# Patient Record
Sex: Female | Born: 1955
Health system: Southern US, Community
[De-identification: ages and names within clinical notes are randomized; demographics above are authoritative.]

## PROBLEM LIST (undated history)

## (undated) DIAGNOSIS — D219 Benign neoplasm of connective and other soft tissue, unspecified: Secondary | ICD-10-CM

## (undated) DIAGNOSIS — J189 Pneumonia, unspecified organism: Secondary | ICD-10-CM

## (undated) DIAGNOSIS — H539 Unspecified visual disturbance: Secondary | ICD-10-CM

## (undated) DIAGNOSIS — Z8711 Personal history of peptic ulcer disease: Secondary | ICD-10-CM

## (undated) DIAGNOSIS — G5790 Unspecified mononeuropathy of unspecified lower limb: Secondary | ICD-10-CM

## (undated) DIAGNOSIS — H409 Unspecified glaucoma: Secondary | ICD-10-CM

## (undated) DIAGNOSIS — H269 Unspecified cataract: Secondary | ICD-10-CM

## (undated) DIAGNOSIS — M797 Fibromyalgia: Secondary | ICD-10-CM

## (undated) DIAGNOSIS — Z8719 Personal history of other diseases of the digestive system: Secondary | ICD-10-CM

## (undated) HISTORY — DX: Unspecified glaucoma: H40.9

## (undated) HISTORY — DX: Unspecified visual disturbance: H53.9

## (undated) HISTORY — DX: Pneumonia, unspecified organism: J18.9

## (undated) HISTORY — DX: Unspecified mononeuropathy of unspecified lower limb: G57.90

## (undated) HISTORY — DX: Personal history of other diseases of the digestive system: Z87.19

## (undated) HISTORY — DX: Personal history of peptic ulcer disease: Z87.11

## (undated) HISTORY — PX: OTHER SURGICAL HISTORY: SHX169

## (undated) HISTORY — DX: Unspecified cataract: H26.9

## (undated) HISTORY — DX: Fibromyalgia: M79.7

---

## 1998-10-07 HISTORY — PX: OTHER SURGICAL HISTORY: SHX169

## 2004-02-06 HISTORY — PX: OTHER SURGICAL HISTORY: SHX169

## 2008-11-05 HISTORY — PX: OTHER SURGICAL HISTORY: SHX169

## 2010-11-06 HISTORY — PX: OTHER SURGICAL HISTORY: SHX169

## 2015-06-25 DIAGNOSIS — Z23 Encounter for immunization: Secondary | ICD-10-CM | POA: Diagnosis not present

## 2015-08-23 DIAGNOSIS — M19042 Primary osteoarthritis, left hand: Secondary | ICD-10-CM | POA: Diagnosis not present

## 2015-08-23 DIAGNOSIS — M25562 Pain in left knee: Secondary | ICD-10-CM | POA: Diagnosis not present

## 2015-08-23 DIAGNOSIS — M797 Fibromyalgia: Secondary | ICD-10-CM | POA: Diagnosis not present

## 2015-08-23 DIAGNOSIS — M25561 Pain in right knee: Secondary | ICD-10-CM | POA: Diagnosis not present

## 2015-08-23 DIAGNOSIS — M19041 Primary osteoarthritis, right hand: Secondary | ICD-10-CM | POA: Diagnosis not present

## 2015-08-23 DIAGNOSIS — F5104 Psychophysiologic insomnia: Secondary | ICD-10-CM | POA: Diagnosis not present

## 2015-10-24 DIAGNOSIS — M5137 Other intervertebral disc degeneration, lumbosacral region: Secondary | ICD-10-CM | POA: Diagnosis not present

## 2015-10-24 DIAGNOSIS — M9902 Segmental and somatic dysfunction of thoracic region: Secondary | ICD-10-CM | POA: Diagnosis not present

## 2015-10-24 DIAGNOSIS — M9904 Segmental and somatic dysfunction of sacral region: Secondary | ICD-10-CM | POA: Diagnosis not present

## 2015-10-24 DIAGNOSIS — M9903 Segmental and somatic dysfunction of lumbar region: Secondary | ICD-10-CM | POA: Diagnosis not present

## 2015-10-26 DIAGNOSIS — M9902 Segmental and somatic dysfunction of thoracic region: Secondary | ICD-10-CM | POA: Diagnosis not present

## 2015-10-26 DIAGNOSIS — M9903 Segmental and somatic dysfunction of lumbar region: Secondary | ICD-10-CM | POA: Diagnosis not present

## 2015-10-26 DIAGNOSIS — M5137 Other intervertebral disc degeneration, lumbosacral region: Secondary | ICD-10-CM | POA: Diagnosis not present

## 2015-10-26 DIAGNOSIS — M9904 Segmental and somatic dysfunction of sacral region: Secondary | ICD-10-CM | POA: Diagnosis not present

## 2015-11-03 DIAGNOSIS — M961 Postlaminectomy syndrome, not elsewhere classified: Secondary | ICD-10-CM | POA: Diagnosis not present

## 2015-11-23 DIAGNOSIS — M961 Postlaminectomy syndrome, not elsewhere classified: Secondary | ICD-10-CM | POA: Diagnosis not present

## 2015-11-30 DIAGNOSIS — H40003 Preglaucoma, unspecified, bilateral: Secondary | ICD-10-CM | POA: Diagnosis not present

## 2015-12-07 DIAGNOSIS — M961 Postlaminectomy syndrome, not elsewhere classified: Secondary | ICD-10-CM | POA: Diagnosis not present

## 2015-12-07 DIAGNOSIS — M47816 Spondylosis without myelopathy or radiculopathy, lumbar region: Secondary | ICD-10-CM | POA: Diagnosis not present

## 2016-01-13 DIAGNOSIS — M47816 Spondylosis without myelopathy or radiculopathy, lumbar region: Secondary | ICD-10-CM | POA: Diagnosis not present

## 2016-01-13 DIAGNOSIS — M5136 Other intervertebral disc degeneration, lumbar region: Secondary | ICD-10-CM | POA: Diagnosis not present

## 2016-01-27 DIAGNOSIS — M961 Postlaminectomy syndrome, not elsewhere classified: Secondary | ICD-10-CM | POA: Diagnosis not present

## 2016-01-27 DIAGNOSIS — M47816 Spondylosis without myelopathy or radiculopathy, lumbar region: Secondary | ICD-10-CM | POA: Diagnosis not present

## 2016-02-09 DIAGNOSIS — J180 Bronchopneumonia, unspecified organism: Secondary | ICD-10-CM | POA: Diagnosis not present

## 2016-02-18 DIAGNOSIS — M961 Postlaminectomy syndrome, not elsewhere classified: Secondary | ICD-10-CM | POA: Diagnosis not present

## 2016-02-18 DIAGNOSIS — M47816 Spondylosis without myelopathy or radiculopathy, lumbar region: Secondary | ICD-10-CM | POA: Diagnosis not present

## 2016-03-08 ENCOUNTER — Emergency Department (HOSPITAL_COMMUNITY): Payer: BLUE CROSS/BLUE SHIELD

## 2016-03-08 ENCOUNTER — Encounter (HOSPITAL_COMMUNITY): Payer: Self-pay

## 2016-03-08 ENCOUNTER — Emergency Department (HOSPITAL_COMMUNITY)
Admission: EM | Admit: 2016-03-08 | Discharge: 2016-03-08 | Disposition: A | Discharge status: Home / Self Care | Payer: BLUE CROSS/BLUE SHIELD | Attending: Emergency Medicine | Admitting: Emergency Medicine

## 2016-03-08 ENCOUNTER — Other Ambulatory Visit: Payer: Self-pay

## 2016-03-08 DIAGNOSIS — R791 Abnormal coagulation profile: Secondary | ICD-10-CM | POA: Insufficient documentation

## 2016-03-08 DIAGNOSIS — H40003 Preglaucoma, unspecified, bilateral: Secondary | ICD-10-CM | POA: Diagnosis not present

## 2016-03-08 DIAGNOSIS — R51 Headache: Secondary | ICD-10-CM | POA: Diagnosis not present

## 2016-03-08 DIAGNOSIS — H538 Other visual disturbances: Secondary | ICD-10-CM | POA: Diagnosis not present

## 2016-03-08 DIAGNOSIS — R519 Headache, unspecified: Secondary | ICD-10-CM

## 2016-03-08 HISTORY — DX: Benign neoplasm of connective and other soft tissue, unspecified: D21.9

## 2016-03-08 LAB — I-STAT TROPONIN, ED: Troponin i, poc: 0 ng/mL (ref 0.00–0.08)

## 2016-03-08 LAB — COMPREHENSIVE METABOLIC PANEL
ALT: 20 U/L (ref 14–54)
ANION GAP: 13 (ref 5–15)
AST: 27 U/L (ref 15–41)
Albumin: 4.2 g/dL (ref 3.5–5.0)
Alkaline Phosphatase: 67 U/L (ref 38–126)
BUN: 8 mg/dL (ref 6–20)
CHLORIDE: 102 mmol/L (ref 101–111)
CO2: 24 mmol/L (ref 22–32)
Calcium: 9.7 mg/dL (ref 8.9–10.3)
Creatinine, Ser: 0.75 mg/dL (ref 0.44–1.00)
GFR calc non Af Amer: >60 mL/min (ref >60)
Glucose, Bld: 123 mg/dL — ABNORMAL HIGH (ref 65–99)
POTASSIUM: 3.9 mmol/L (ref 3.5–5.1)
SODIUM: 139 mmol/L (ref 135–145)
Total Bilirubin: 0.6 mg/dL (ref 0.3–1.2)
Total Protein: 6.7 g/dL (ref 6.5–8.1)

## 2016-03-08 LAB — CBC
HCT: 37 % (ref 36.0–46.0)
Hemoglobin: 12.1 g/dL (ref 12.0–15.0)
MCH: 30.5 pg (ref 26.0–34.0)
MCHC: 32.7 g/dL (ref 30.0–36.0)
MCV: 93.2 fL (ref 78.0–100.0)
PLATELETS: 352 10*3/uL (ref 150–400)
RBC: 3.97 MIL/uL (ref 3.87–5.11)
RDW: 14.4 % (ref 11.5–15.5)
WBC: 6.2 10*3/uL (ref 4.0–10.5)

## 2016-03-08 LAB — DIFFERENTIAL
BASOS ABS: 0 10*3/uL (ref 0.0–0.1)
BASOS PCT: 0 %
EOS ABS: 0.2 10*3/uL (ref 0.0–0.7)
Eosinophils Relative: 3 %
Lymphocytes Relative: 24 %
Lymphs Abs: 1.5 10*3/uL (ref 0.7–4.0)
MONO ABS: 0.3 10*3/uL (ref 0.1–1.0)
Monocytes Relative: 4 %
NEUTROS ABS: 4.2 10*3/uL (ref 1.7–7.7)
Neutrophils Relative %: 69 %

## 2016-03-08 LAB — APTT: APTT: 35 s (ref 24–36)

## 2016-03-08 LAB — PROTIME-INR
INR: 0.98
PROTHROMBIN TIME: 13 s (ref 11.4–15.2)

## 2016-03-08 MED ORDER — IOPAMIDOL (ISOVUE-370) INJECTION 76%
INTRAVENOUS | Status: AC
  Filled 2016-03-08: qty 50

## 2016-03-08 NOTE — ED Triage Notes (Signed)
Patient sent from opthalmology for blurred vision and increased headache x 2 weeks. States has ongoing blurred vision and ophthalmologist states not related to eyes. Alert and oriented

## 2016-03-08 NOTE — ED Provider Notes (Signed)
[facility] DEPT Provider Note   CSN: [id number] Arrival date & time: 03/08/16  1314     History   Chief Complaint Chief Complaint  Patient presents with  . blurred vision Rebecca Rose    HPI Rebecca Rose is a 61 y.o. female.  Rebecca Rose is a 61 y.o. Female with a history of glaucoma who presents to the ED complaining of left blurry vision, left eye pain, and headache for the past three weeks. Patient reports three weeks ago she had an episode of painless loss of vision to her left eye that lasted for about three minutes, then resolved spontaneously. She reports since then she has had blurry vision in her left eye and pain. She was seen by her opthalmologist Dr. Arlina Robes today who did a normal eye work up. Normal eye pressures. Dr. Arlina Robes says her pain and blurry vision is not related to her eyes and needs TIA work up. She was sent to the ER for stroke work up. Patient does not wear contacts or glasses. Patient denies fevers, neck pain, temporal pain, double vision, abdominal pain, nausea, vomiting, numbness, tingling, weakness, head injury, falls, lightheadedness, dizziness, or rashes.   The history is provided by the patient and medical records. No language interpreter was used.    Past Medical History:  Diagnosis Date  . Fibroleiomyoma     There are no active problems to display for this patient.   History reviewed. No pertinent surgical history.  OB History    No data available       Home Medications    Prior to Admission medications   Medication Sig Start Date End Date Taking? Authorizing Provider  acetaminophen (TYLENOL) 325 MG tablet Take 650 mg by mouth 2 (two) times daily.   Yes Historical Provider, MD  Ascorbic Acid (VITAMIN C) 1000 MG tablet Take 1,000 mg by mouth daily.   Yes Historical Provider, MD  Calcium Polycarbophil (FIBER-CAPS PO) Take 1 capsule by mouth daily.   Yes Historical Provider, MD  Cholecalciferol (VITAMIN D3) 5000 units CAPS Take 5,000  Units by mouth daily.   Yes Historical Provider, MD  traMADol (ULTRAM) 50 MG tablet Take 50 mg by mouth daily as needed for pain. 01/26/16  Yes Historical Provider, MD  traMADol (ULTRAM-ER) 200 MG 24 hr tablet Take 200 mg by mouth at bedtime.   Yes Historical Provider, MD  vitamin B-12 (CYANOCOBALAMIN) 1000 MCG tablet Take 1,000 mcg by mouth daily.   Yes Historical Provider, MD    Family History No family history on file.  Social History Social History  Substance Use Topics  . Smoking status: Never Smoker  . Smokeless tobacco: Never Used  . Alcohol use Not on file     Allergies   Patient has no known allergies.   Review of Systems Review of Systems  Constitutional: Negative for chills and fever.  HENT: Negative for congestion and sore throat.   Eyes: Positive for pain and visual disturbance. Negative for photophobia, discharge, redness and itching.  Respiratory: Negative for cough and shortness of breath.   Cardiovascular: Negative for chest pain.  Gastrointestinal: Negative for abdominal pain, diarrhea, nausea and vomiting.  Genitourinary: Negative for dysuria.  Musculoskeletal: Negative for back pain and neck pain.  Skin: Negative for rash.  Neurological: Positive for headaches. Negative for dizziness, syncope, weakness, light-headedness and numbness.     Physical Exam Updated Vital Signs BP 124/89   Pulse 77   Temp 98.2 F (36.8 C)   Resp 16  Ht 5\' 5"  (1.651 m)   Wt 79.4 kg   SpO2 100%   BMI 29.12 kg/m   Physical Exam  Constitutional: She is oriented to person, place, and time. She appears well-developed and well-nourished. No distress.  Nontoxic appearing.  HENT:  Head: Normocephalic and atraumatic.  Right Ear: External ear normal.  Left Ear: External ear normal.  Mouth/Throat: Oropharynx is clear and moist.  No temporal edema or tenderness.  Eyes: Conjunctivae and EOM are normal. Pupils are equal, round, and reactive to light. Right eye exhibits no  discharge. Left eye exhibits no discharge. No scleral icterus.  EOMs are intact. Vision is grossly intact.  Neck: Normal range of motion. Neck supple. No JVD present. No tracheal deviation present.  Cardiovascular: Normal rate, regular rhythm, normal heart sounds and intact distal pulses.  Exam reveals no gallop and no friction rub.   No murmur heard. Pulmonary/Chest: Effort normal and breath sounds normal. No stridor. No respiratory distress. She has no wheezes. She has no rales.  Abdominal: Soft. There is no tenderness.  Musculoskeletal: She exhibits no edema.  Lymphadenopathy:    She has no cervical adenopathy.  Neurological: She is alert and oriented to person, place, and time. No cranial nerve deficit or sensory deficit. She exhibits normal muscle tone. Coordination normal.  Patient is alert and oriented 3. Cranial nerves are intact. Speech is clear and coherent. EOMs are intact. Vision is grossly intact. Normal gait. Sensation is intact in her bilateral upper and lower extremities. Good grip strengths bilaterally. No pronator drift. Finger to nose intact bilaterally.  Skin: Skin is warm and dry. Capillary refill takes less than 2 seconds. No rash noted. She is not diaphoretic. No erythema. No pallor.  Psychiatric: She has a normal mood and affect. Her behavior is normal.  Nursing note and vitals reviewed.    ED Treatments / Results  Labs (all labs ordered are listed, but only abnormal results are displayed) Labs Reviewed  COMPREHENSIVE METABOLIC PANEL - Abnormal; Notable for the following:       Result Value   Glucose, Bld 123 (*)    All other components within normal limits  PROTIME-INR  APTT  CBC  DIFFERENTIAL  I-STAT TROPOININ, ED    EKG  EKG Interpretation None       Radiology Ct Head Wo Contrast  Result Date: 03/08/2016 CLINICAL DATA:  Pt having headache x 3 weeks. Today pt having blurry vision left eye and for a moment unable to see out of left eye. EXAM: CT  HEAD WITHOUT CONTRAST TECHNIQUE: Contiguous axial images were obtained from the base of the skull through the vertex without intravenous contrast. COMPARISON:  None. FINDINGS: Brain: No mass lesion, hemorrhage, hydrocephalus, acute infarct, intra-axial, or extra-axial fluid collection. Vascular: No hyperdense vessel or unexpected calcification. Skull: Normal Sinuses/Orbits: Normal orbits and globes. Clear paranasal sinuses and mastoid air cells. Other: None IMPRESSION: Normal head CT for age. Electronically Signed   By: Abigail Miyamoto M.D.   On: 03/08/2016 14:13    Procedures Procedures (including critical care time)  Medications Ordered in ED Medications  iopamidol (ISOVUE-370) 76 % injection (not administered)  iopamidol (ISOVUE-370) 76 % injection (not administered)     Initial Impression / Assessment and Plan / ED Course  I have reviewed the triage vital signs and the nursing notes.  Pertinent labs & imaging results that were available during my care of the patient were reviewed by me and considered in my medical decision making (see chart for  details).    This is a 61 y.o. Female with a history of glaucoma who presents to the ED complaining of left blurry vision, left eye pain, and headache for the past three weeks. Patient reports three weeks ago she had an episode of painless loss of vision to her left eye that lasted for about three minutes, then resolved spontaneously. She reports since then she has had blurry vision in her left eye and pain. She was seen by her opthalmologist Dr. Arlina Robes today who did a normal eye work up. Normal eye pressures. Dr. Arlina Robes says her pain and blurry vision is not related to her eyes and needs TIA work up. She was sent to the ER for stroke work up. Patient does not wear contacts or glasses. Patient denies fevers, neck pain, temporal pain, double vision. On exam the patient is afebrile nontoxic appearing. She has no focal neurological deficits. No temporal edema or  tenderness. Normal gait. Vision is grossly intact. Medical records indicate ocular pressures of 20 mmHg bilaterally today at ophthalmologist office. Blood work ears unremarkable. CBC and CMP are within normal limits. CT head w/o contrast shows no acute findings.  I consulted with neurologist Dr. Leonel Ramsay who would like the patient to have an CTA of her head and neck. If this shows no acute findings she can follow up as an outpatient with [facility]. Patient initially agrees with this plan.   Later, RN staff were not able to obtain and IV after one stick. Plan was for IV team consult. Patient told nursing staff that she does not want additional IV sticks and would like to follow-up as an outpatient for this study. I went back to bedside to again explain the importance of why we'll obtain a CT angiogram. Patient tells me she understands this but would rather do this as an outpatient. She reports this happened 3 weeks ago and feels this can wait until she follows up as an outpatient. I again explained the importance and she verbalizes understanding. Patient will leave [place]. I encouraged her to follow-up with [facility]. She agrees with this plan. I advised the patient to follow-up with their primary care provider this week. I advised the patient to return to the emergency department with new or worsening symptoms or new concerns.   Final Clinical Impressions(s) / ED Diagnoses   Final diagnoses:  Blurry vision, left eye  Bad headache    New Prescriptions New Prescriptions   No medications on file     Waynetta Pean, PA-C 03/08/16 1933    Quintella Reichert, MD 03/10/16 [phone]

## 2016-03-20 DIAGNOSIS — Z5181 Encounter for therapeutic drug level monitoring: Secondary | ICD-10-CM | POA: Diagnosis not present

## 2016-03-20 DIAGNOSIS — M791 Myalgia: Secondary | ICD-10-CM | POA: Diagnosis not present

## 2016-03-20 DIAGNOSIS — M961 Postlaminectomy syndrome, not elsewhere classified: Secondary | ICD-10-CM | POA: Diagnosis not present

## 2016-03-20 DIAGNOSIS — M5416 Radiculopathy, lumbar region: Secondary | ICD-10-CM | POA: Diagnosis not present

## 2016-03-20 DIAGNOSIS — Z79899 Other long term (current) drug therapy: Secondary | ICD-10-CM | POA: Diagnosis not present

## 2016-03-28 ENCOUNTER — Ambulatory Visit (INDEPENDENT_AMBULATORY_CARE_PROVIDER_SITE_OTHER): Payer: BLUE CROSS/BLUE SHIELD | Admitting: Interventional Cardiology

## 2016-03-28 ENCOUNTER — Encounter: Payer: Self-pay | Admitting: Interventional Cardiology

## 2016-03-28 ENCOUNTER — Ambulatory Visit (INDEPENDENT_AMBULATORY_CARE_PROVIDER_SITE_OTHER): Payer: BLUE CROSS/BLUE SHIELD | Admitting: Neurology

## 2016-03-28 ENCOUNTER — Encounter (INDEPENDENT_AMBULATORY_CARE_PROVIDER_SITE_OTHER): Payer: Self-pay

## 2016-03-28 ENCOUNTER — Encounter: Payer: Self-pay | Admitting: Neurology

## 2016-03-28 VITALS — BP 138/82 | HR 78 | Ht 65.0 in | Wt 179.2 lb

## 2016-03-28 VITALS — BP 106/80 | HR 96 | Ht 65.0 in | Wt 178.8 lb

## 2016-03-28 DIAGNOSIS — R0609 Other forms of dyspnea: Secondary | ICD-10-CM

## 2016-03-28 DIAGNOSIS — H5462 Unqualified visual loss, left eye, normal vision right eye: Secondary | ICD-10-CM

## 2016-03-28 DIAGNOSIS — G458 Other transient cerebral ischemic attacks and related syndromes: Secondary | ICD-10-CM | POA: Diagnosis not present

## 2016-03-28 DIAGNOSIS — G459 Transient cerebral ischemic attack, unspecified: Secondary | ICD-10-CM | POA: Diagnosis not present

## 2016-03-28 DIAGNOSIS — R0600 Dyspnea, unspecified: Secondary | ICD-10-CM

## 2016-03-28 DIAGNOSIS — H539 Unspecified visual disturbance: Secondary | ICD-10-CM | POA: Diagnosis not present

## 2016-03-28 NOTE — Progress Notes (Signed)
Cardiology Office Note   Date:  03/28/2016   ID:  Rebecca Rose, DOB 03-12-1955, MRN [id number]  PCP:  No PCP Per Patient    Chief Complaint  Patient presents with  . New Patient (Initial Visit)     Wt Readings from Last 3 Encounters:  03/28/16 178 lb 12.8 oz (81.1 kg)  03/28/16 179 lb 3.2 oz (81.3 kg)  03/08/16 175 lb (79.4 kg)       History of Present Illness: Rebecca Rose is a 61 y.o. female  Who had a visual disturbance in Jan 2018.  It resolved after a few minutes.  She had known glaucoma and cataracts.  She saw her eye MD a few weeks later who sent her to the ER.  She follwoed up as an outpatient.  THe concern was for TIA.  Her mother has heart disease.  No MI.  She has had caths but no CABG.  She s 13.  Father has had TIA in the past.   No h/o vascular disease in her siblings.    She has had RSD and associated leg pain.    The patient had an echo 20 years ago that was normal.   She had a coronary CT that was normal.    Her job is busy, 55-60 hours / week.  She has chronic back pain and is not able to exercise.  She has had several back surgeries and injections.  She is one some meds for chronic pain.  She has gained weight in the past few years.      Past Medical History:  Diagnosis Date  . Cataracts, bilateral   . Fibroleiomyoma   . Fibromyalgia   . Glaucoma   . History of stomach ulcers   . Neuropathy of leg   . Vision abnormalities     Past Surgical History:  Procedure Laterality Date  . bone spur toe left foot    . carpal tunnel both hands  2005-2006  . fusion lower back  11/2008  . fusion lower back  10/1998  . fusion to lower back  11/2010  . knee arthoscopy  1995/2000   five on right leg  . ulkna release right  2006     Current Outpatient Prescriptions  Medication Sig Dispense Refill  . acetaminophen (TYLENOL) 325 MG tablet Take 650 mg by mouth 2 (two) times daily.    . Ascorbic Acid (VITAMIN C) 1000 MG tablet Take 1,000 mg by  mouth daily.    . Cholecalciferol (VITAMIN D3) 5000 units CAPS Take 5,000 Units by mouth daily.    Marland Kitchen gabapentin (NEURONTIN) 300 MG capsule Take 300 mg by mouth at bedtime.     Mar[place] Kitchen loratadine (CLARITIN) 10 MG tablet Take 10 mg by mouth daily.    [place] Kitchen OMEPRAZOLE PO Take 40 mg by mouth.    . traMADol (ULTRAM-ER) 200 MG 24 hr tablet Take 100 mg by mouth as needed.     Mar[place] Kitchen UNABLE TO FIND Take 2 tablets by mouth daily. hylands leg cramps 2 in am    . vitamin B-12 (CYANOCOBALAMIN) 1000 MCG tablet Take 1,000 mcg by mouth daily.      No current facility-administered medications for this visit.     Allergies:   Nsaids    Social History:  The patient  reports that she has never smoked. She has never used smokeless tobacco. She reports that she does not drink alcohol or use drugs.   Family History:  The patient's family  history includes Transient ischemic attack in her father.    ROS:  Please see the history of present illness.   Otherwise, review of systems are positive for visual changes as noted above, nonesince that time.   All other systems are reviewed and negative.    PHYSICAL EXAM: VS:  BP 106/80   Pulse 96   Ht 5\' 5"  (1.651 m)   Wt 178 lb 12.8 oz (81.1 kg)   BMI 29.75 kg/m  , BMI Body mass index is 29.75 kg/m. GEN: Well nourished, well developed, in no acute distress  HEENT: normal  Neck: no JVD, carotid bruits, or masses Cardiac: RRR; no murmurs, rubs, or gallops,no edema  Respiratory:  clear to auscultation bilaterally, normal work of breathing GI: soft, nontender, nondistended, + BS MS: no deformity or atrophy  Skin: warm and dry, no rash Neuro:  Strength and sensation are intact Psych: euthymic mood, full affect   EKG:   The ekg ordered 03/08/16 demonstrates Sinus bradycardia, nonspecific ST changes   Recent Labs: 03/08/2016: ALT 20; BUN 8; Creatinine, Ser 0.75; Hemoglobin 12.1; Platelets 352; Potassium 3.9; Sodium 139   Lipid Panel No results found for: CHOL, TRIG, HDL,  CHOLHDL, VLDL, LDLCALC, LDLDIRECT   Other studies Reviewed: Additional studies/ records that were reviewed today with results demonstrating: ER notes.   ASSESSMENT AND PLAN:  1. Visual changes: Concerning for TIA. Will order echocardiogram to evaluate for any structural heart disease that may predispose her to embolic neurologic event. She has an MRI and carotid Dopplers pending as well. 2. I will defer to neurology as to whether they feel she needs cardiac rhythm monitoring to look for arrhythmia that would put her at risk for stroke such as atrial fibrillation. She has not had any documented atrial fibrillation on ECG. 3. We spoke about the importance of risk factor modification. She is hoping to have her back pain improved so that she can walk more. She is hoping to lose weight as well with diet and exercise.  She has some DOE that she attributes to lack of exercise.    Current medicines are reviewed at length with the patient today.  The patient concerns regarding her medicines were addressed.  The following changes have been made:  No change  Labs/ tests ordered today include:  No orders of the defined types were placed in this encounter.   Recommend 150 minutes/week of aerobic exercise Low fat, low carb, high fiber diet recommended  Disposition:   FU prn   Signed, Larae Grooms, MD  03/28/2016 2:35 PM    [facility] [place], [place], [place]  [address] Phone: [phone]; Fax: [phone]

## 2016-03-28 NOTE — Patient Instructions (Signed)
Remember to drink plenty of fluid, eat healthy meals and do not skip any meals. Try to eat protein with a every meal and eat a healthy snack such as fruit or nuts in between meals. Try to keep a regular sleep-wake schedule and try to exercise daily, particularly in the form of walking, 20-30 minutes a day, if you can.   As far as your medications are concerned, I would like to suggest: Daily enteric coated baby aspirin, may consider a statin for cholesterol  As far as diagnostic testing: Labs, MRI brain and MRA head (Brain and blood vessels), echocardiogram of heart, carotid dopplers of the neck  I would like to see you back in 6 months, sooner if we need to. Please call us with any interim questions, concerns, problems, updates or refill requests.   Our phone number is [phone]. We also have an after hours call service for urgent matters and there is a physician on-call for urgent questions. For any emergencies you know to call 911 or go to the nearest emergency room   Transient Ischemic Attack A transient ischemic attack (TIA) is a "warning stroke" that causes stroke-like symptoms. Unlike a stroke, a TIA does not cause permanent damage to the brain. The symptoms of a TIA can happen very fast and do not last long. It is important to know the symptoms of a TIA and what to do. This can help prevent a major stroke or death. What are the causes? A TIA is caused by a temporary blockage in an artery in the brain or neck (carotid artery). The blockage does not allow the brain to get the blood supply it needs and can cause different symptoms. The blockage can be caused by either:  A blood clot.  Fatty buildup (plaque) in a neck or brain artery. What increases the risk?  High blood pressure (hypertension).  High cholesterol.  Diabetes mellitus.  Heart disease.  The buildup of plaque in the blood vessels (peripheral artery disease or atherosclerosis).  The buildup of plaque in the blood  vessels that provide blood and oxygen to the brain (carotid artery stenosis).  An abnormal heart rhythm (atrial fibrillation).  Obesity.  Using any tobacco products, including cigarettes, chewing tobacco, or electronic cigarettes.  Taking oral contraceptives, especially in combination with using tobacco.  Physical inactivity.  A diet high in fats, salt (sodium), and calories.  Excessive alcohol use.  Use of illegal drugs (especially cocaine and methamphetamine).  Being female.  Being African American.  Being over the age of 56 years.  Family history of stroke.  Previous history of blood clots, stroke, TIA, or heart attack.  Sickle cell disease. What are the signs or symptoms? TIA symptoms are the same as a stroke but are temporary. These symptoms usually develop suddenly, or may be newly present upon waking from sleep:  Sudden weakness or numbness of the face, arm, or leg, especially on one side of the body.  Sudden trouble walking or difficulty moving arms or legs.  Sudden confusion.  Sudden personality changes.  Trouble speaking (aphasia) or understanding.  Difficulty swallowing.  Sudden trouble seeing in one or both eyes.  Double vision.  Dizziness.  Loss of balance or coordination.  Sudden severe headache with no known cause.  Trouble reading or writing.  Loss of bowel or bladder control.  Loss of consciousness. How is this diagnosed? Your health care provider may be able to determine the presence or absence of a TIA based on your symptoms, history,  and physical exam. CT scan of the brain is usually performed to help identify a TIA. Other tests may include:  Electrocardiography (ECG).  Continuous heart monitoring.  Echocardiography.  Carotid ultrasonography.  MRI.  A scan of the brain circulation.  Blood tests. How is this treated? Since the symptoms of TIA are the same as a stroke, it is important to seek treatment as soon as possible.  You may need a medicine to dissolve a blood clot (thrombolytic) if that is the cause of the TIA. This medicine cannot be given if too much time has passed. Treatment may also include:  Rest, oxygen, fluids through an IV tube, and medicines to thin the blood (anticoagulants).  Measures will be taken to prevent short-term and long-term complications, including infection from breathing foreign material into the lungs (aspiration pneumonia), blood clots in the legs, and falls.  Procedures to either remove plaque in the carotid arteries or dilate carotid arteries that have narrowed due to plaque. Those procedures are:  Carotid endarterectomy.  Carotid angioplasty and stenting.  Medicines and diet may be used to address diabetes, high blood pressure, and other underlying risk factors. Follow these instructions at home:  Take medicines only as directed by your health care provider. Follow the directions carefully. Medicines may be used to control risk factors for a stroke. Be sure you understand all your medicine instructions.  You may be told to take aspirin or the anticoagulant warfarin. Warfarin needs to be taken exactly as instructed.  Taking too much or too little warfarin is dangerous. Too much warfarin increases the risk of bleeding. Too little warfarin continues to allow the risk for blood clots. While taking warfarin, you will need to have regular blood tests to measure your blood clotting time. A PT blood test measures how long it takes for blood to clot. Your PT is used to calculate another value called an INR. Your PT and INR help your health care provider to adjust your dose of warfarin. The dose can change for many reasons. It is critically important that you take warfarin exactly as prescribed.  Many foods, especially foods high in vitamin K can interfere with warfarin and affect the PT and INR. Foods high in vitamin K include spinach, kale, broccoli, cabbage, collard and turnip greens,  Brussels sprouts, peas, cauliflower, seaweed, and parsley, as well as beef and pork liver, green tea, and soybean oil. You should eat a consistent amount of foods high in vitamin K. Avoid major changes in your diet, or notify your health care provider before changing your diet. Arrange a visit with a dietitian to answer your questions.  Many medicines can interfere with warfarin and affect the PT and INR. You must tell your health care provider about any and all medicines you take; this includes all vitamins and supplements. Be especially cautious with aspirin and anti-inflammatory medicines. Do not take or discontinue any prescribed or over-the-counter medicine except on the advice of your health care provider or pharmacist.  Warfarin can have side effects, such as excessive bruising or bleeding. You will need to hold pressure over cuts for longer than usual. Your health care provider or pharmacist will discuss other potential side effects.  Avoid sports or activities that may cause injury or bleeding.  Be careful when shaving, flossing your teeth, or handling sharp objects.  Alcohol can change the body's ability to handle warfarin. It is best to avoid alcoholic drinks or consume only very small amounts while taking warfarin. Notify your health  care provider if you change your alcohol intake.  Notify your dentist or other health care providers before procedures.  Eat a diet that includes 5 or more servings of fruits and vegetables each day. This may reduce the risk of stroke. Certain diets may be prescribed to address high blood pressure, high cholesterol, diabetes, or obesity.  A diet low in sodium, saturated fat, trans fat, and cholesterol is recommended to manage high blood pressure.  A diet low in saturated fat, trans fat, and cholesterol, and high in fiber may control cholesterol levels.  A controlled-carbohydrate, controlled-sugar diet is recommended to manage diabetes.  A  reduced-calorie diet that is low in sodium, saturated fat, trans fat, and cholesterol is recommended to manage obesity.  Maintain a healthy weight.  Stay physically active. It is recommended that you get at least 30 minutes of activity on most or all days.  Do not use any tobacco products, including cigarettes, chewing tobacco, or electronic cigarettes. If you need help quitting, ask your health care provider.  Limit alcohol intake to no more than 1 drink per day for nonpregnant women and 2 drinks per day for men. One drink equals 12 ounces of beer, 5 ounces of wine, or 1 ounces of hard liquor.  Do not abuse drugs.  A safe home environment is important to reduce the risk of falls. Your health care provider may arrange for specialists to evaluate your home. Having grab bars in the bedroom and bathroom is often important. Your health care provider may arrange for equipment to be used at home, such as raised toilets and a seat for the shower.  Follow all instructions for follow-up with your health care provider. This is very important. This includes any referrals and lab tests. Proper follow-up can prevent a stroke or another TIA from occurring. How is this prevented? The risk of a TIA can be decreased by appropriately treating high blood pressure, high cholesterol, diabetes, heart disease, and obesity, and by quitting smoking, limiting alcohol, and staying physically active. Contact a health care provider if:  You have personality changes.  You have difficulty swallowing.  You are seeing double.  You have dizziness.  You have a fever. Get help right away if: Any of the following symptoms may represent a serious problem that is an emergency. Do not wait to see if the symptoms will go away. Get medical help right away. Call your local emergency services (911 in [place] Do not drive yourself to the hospital.  You have sudden weakness or numbness of the face, arm, or leg, especially on one  side of the body.  You have sudden trouble walking or difficulty moving arms or legs.  You have sudden confusion.  You have trouble speaking (aphasia) or understanding.  You have sudden trouble seeing in one or both eyes.  You have a loss of balance or coordination.  You have a sudden, severe headache with no known cause.  You have new chest pain or an irregular heartbeat.  You have a partial or total loss of consciousness. This information is not intended to replace advice given to you by your health care provider. Make sure you discuss any questions you have with your health care provider. Document Released: 11/01/2004 Document Revised: 09/26/2015 Document Reviewed: 04/29/2013 Elsevier Interactive Patient Education  2017 [employer].

## 2016-03-28 NOTE — Progress Notes (Signed)
[facility]    Provider:  Dr Jaynee Eagles Referring Provider: No ref. provider found Primary Care Physician:  No PCP Per Patient  CC:  Blurred vision, vision loss  HPI:  Rebecca Rose is a 61 y.o. female here as a referral from Dr. No ref. provider found for blurred vision and vision loss of left eye. Past medical history osteoarthritis, fibromyalgia,posterior vitreous detachment both eyes, migraines. Patient reports an episode of painless, complete loss of vision left eye in January which lasted about 3 minutes and self resolved. Has not recurred. Ophthalmologist does not feel this episode is explained by eye findings. Suggested evaluation for TIA or stroke.Happened in January, she was walking to the cafeteria and all of a sudden some pulled a curtain over her left eye, it was completely black, couldn;t see light or shaows. Lasted a minute and went away. She continued to have blurred vision and headaches. The blurred vision is betetr, headaches improved. The left eye stayed blurred for 3 weeks and daily headaches. Still with headaches occasionally. No pain on eye movement. The headache is an ache on the right. She has a history of migraines last one 22 years ago. She is not on a baby aspirin. No other focal neurologic deficits, associated symptoms, inciting events or modifiable factors.   Reviewed notes, labs and imaging from outside physicians, which showed: Patient was  The emergency room by her ophthalmology 03/09/2016 for blurred vision and increase headache for 2 weeks. Has been ongoing or 2 weeks.She complained of left eye pain and headache. 3 weeks prior she had an episode of painless loss of vision to her left eye that lasted about 3 minutes then resolved spontaneously. Since then she's had blurry vision in her left eye pain. She was seen by her ophthalmologist within normal workup. Normal high pressures. Needs TIA workup. She was sent to the emergency room for stroke  workup.  Reviewed ophthalmology notes.Acuity in the right eye 20/25 acuity in the left eye 20/25. External exam normal, slit lamp exam normal, funduscopic exam normal, visual field interpretation normal. Anatomical narrow angle borderline glaucoma both eyes     CT head 03/08/2016 showed No acute intracranial abnormalities including mass lesion or mass effect, hydrocephalus, extra-axial fluid collection, midline shift, hemorrhage, or acute infarction, large ischemic events (personally reviewed images)     Review of Systems: Patient complains of symptoms per HPI as well as the following symptoms: fatigue, blurred vision, headache, weakness, joint pain. Pertinent negatives per HPI. All others negative.   Social History   Social History  . Marital status: Married    Spouse name: N/A  . Number of children: N/A  . Years of education: N/A   Occupational History  . Art gallery manager    Social History Main Topics  . Smoking status: Never Smoker  . Smokeless tobacco: Never Used  . Alcohol use No  . Drug use: No  . Sexual activity: Not on file   Other Topics Concern  . Not on file   Social History Narrative  . No narrative on file    Family History  Problem Relation Age of Onset  . Transient ischemic attack Father     Past Medical History:  Diagnosis Date  . Cataracts, bilateral   . Fibroleiomyoma   . Fibromyalgia   . Glaucoma   . History of stomach ulcers   . Neuropathy of leg   . Vision abnormalities     Past Surgical History:  Procedure Laterality Date  . bone  spur toe left foot    . carpal tunnel both hands  2005-2006  . fusion lower back  11/2008  . fusion lower back  10/1998  . fusion to lower back  11/2010  . knee arthoscopy  1995/2000   five on right leg  . ulkna release right  2006    Current Outpatient Prescriptions  Medication Sig Dispense Refill  . acetaminophen (TYLENOL) 325 MG tablet Take 650 mg by mouth 2 (two) times daily.    . Ascorbic Acid  (VITAMIN C) 1000 MG tablet Take 1,000 mg by mouth daily.    . Cholecalciferol (VITAMIN D3) 5000 units CAPS Take 5,000 Units by mouth daily.    Marland Kitchen gabapentin (NEURONTIN) 300 MG capsule     . loratadine (CLARITIN) 10 MG tablet Take 10 mg by mouth daily.    Marland Kitchen OMEPRAZOLE PO Take 40 mg by mouth.    . traMADol (ULTRAM-ER) 200 MG 24 hr tablet Take 100 mg by mouth as needed.     Marland Kitchen UNABLE TO FIND hylands leg cramps 2 in am prn    . vitamin B-12 (CYANOCOBALAMIN) 1000 MCG tablet Take by mouth.     No current facility-administered medications for this visit.     Allergies as of 03/28/2016 - Review Complete 03/28/2016  Allergen Reaction Noted  . Nsaids Other (See Comments) 03/20/2016    Vitals: BP 138/82   Pulse 78   Ht '5\' 5"'  (1.651 m)   Wt 179 lb 3.2 oz (81.3 kg)   BMI 29.82 kg/m  Last Weight:  Wt Readings from Last 1 Encounters:  03/28/16 179 lb 3.2 oz (81.3 kg)   Last Height:   Ht Readings from Last 1 Encounters:  03/28/16 '5\' 5"'  (1.651 m)    Physical exam: Exam: Gen: NAD, conversant, well nourised, obese, well groomed                     CV: RRR, no MRG. No Carotid Bruits. No peripheral edema, warm, nontender Eyes: Conjunctivae clear without exudates or hemorrhage  Neuro: Detailed Neurologic Exam  Speech:    Speech is normal; fluent and spontaneous with normal comprehension.  Cognition:    The patient is oriented to person, place, and time;     recent and remote memory intact;     language fluent;     normal attention, concentration,     fund of knowledge Cranial Nerves:    The pupils are equal, round, and reactive to light. The fundi are normal and spontaneous venous pulsations are present. Visual fields are full to finger confrontation. Extraocular movements are intact. Trigeminal sensation is intact and the muscles of mastication are normal. The face is symmetric. The palate elevates in the midline. Hearing intact. Voice is normal. Shoulder shrug is normal. The tongue has  normal motion without fasciculations.   Coordination:    Normal finger to nose and heel to shin. Normal rapid alternating movements.   Gait:    Heel-toe and tandem gait are normal.   Motor Observation:    No asymmetry, no atrophy, and no involuntary movements noted. Tone:    Normal muscle tone.    Posture:    Posture is normal. normal erect    Strength:    Strength is V/V in the upper and lower limbs.      Sensation: intact to LT     Reflex Exam:  DTR's:    Deep tendon reflexes in the upper and lower extremities are normal bilaterally.  Toes:    The toes are downgoing bilaterally.   Clonus:    Clonus is absent.      Assessment/Plan:   61 y.o. female here as a referral from Dr. No ref. provider found for blurred vision and vision loss of left eye. Past medical history osteoarthritis, fibromyalgia,posterior vitreous detachment both eyes, migraines. Patient reports an episode of painless, complete loss of vision left eye in January which lasted about 3 minutes and self resolved. Will need a complete stroke workup including imaging and labs, also esr and crp for temporal arteritis, MRI/MRA, echo and carotid dopplers.  I had a long d/w patient about her recent TIA , risk for recurrent stroke/TIAs, personally independently reviewed imaging studies and stroke evaluation results and answered questions.Start ASA 34m for secondary stroke prevention and maintain strict control of hypertension with blood pressure goal below 130/90, diabetes with hemoglobin A1c goal below 6.5% and lipids with LDL cholesterol goal below 70 mg/dL. I also advised the patient to eat a healthy diet with plenty of whole grains, cereals, fruits and vegetables, exercise regularly and maintain ideal body weight .Followup in the future with CHoyle Sauerin 6 months or call earlier if necessary.    ASarina Ill MD  [facility] [address] [address] Phone  [phone] [phone]

## 2016-03-28 NOTE — Patient Instructions (Signed)
Medication Instructions:  None  Labwork: None  Testing/Procedures: Your physician has requested that you have an echocardiogram. Echocardiography is a painless test that uses sound waves to create images of your heart. It provides your doctor with information about the size and shape of your heart and how well your heart's chambers and valves are working. This procedure takes approximately one hour. There are no restrictions for this procedure.    Follow-Up: Your physician recommends that you schedule a follow-up appointment as needed with Dr. Irish Lack.    Any Other Special Instructions Will Be Listed Below (If Applicable).     If you need a refill on your cardiac medications before your next appointment, please call your pharmacy.

## 2016-03-28 NOTE — Addendum Note (Signed)
Addended by: Monte Fantasia on: 03/28/2016 08:45 AM   Modules accepted: Orders

## 2016-03-29 ENCOUNTER — Ambulatory Visit (INDEPENDENT_AMBULATORY_CARE_PROVIDER_SITE_OTHER): Payer: BLUE CROSS/BLUE SHIELD

## 2016-03-29 DIAGNOSIS — H5462 Unqualified visual loss, left eye, normal vision right eye: Secondary | ICD-10-CM | POA: Diagnosis not present

## 2016-03-29 DIAGNOSIS — G458 Other transient cerebral ischemic attacks and related syndromes: Secondary | ICD-10-CM | POA: Diagnosis not present

## 2016-03-29 LAB — LIPID PANEL
Chol/HDL Ratio: 3.8 (ref 0.0–4.4)
Cholesterol, Total: 266 mg/dL — ABNORMAL HIGH (ref 100–199)
HDL: 70 mg/dL (ref >39)
LDL Calculated: 165 — ABNORMAL HIGH (ref 0–99)
TRIGLYCERIDES: 156 mg/dL — AB (ref 0–149)
VLDL Cholesterol Cal: 31 (ref 5–40)

## 2016-03-29 LAB — BASIC METABOLIC PANEL
BUN/Creatinine Ratio: 14 (ref 12–28)
BUN: 10 mg/dL (ref 8–27)
CALCIUM: 10.2 mg/dL (ref 8.7–10.3)
CHLORIDE: 104 mmol/L (ref 96–106)
CO2: 18 mmol/L (ref 18–29)
Creatinine, Ser: 0.72 mg/dL (ref 0.57–1.00)
GFR calc non Af Amer: 90 (ref >59)
GFR, EST AFRICAN AMERICAN: 104 (ref >59)
GLUCOSE: 86 mg/dL (ref 65–99)
Potassium: 4.4 mmol/L (ref 3.5–5.2)
Sodium: 144 mmol/L (ref 134–144)

## 2016-03-29 LAB — HEMOGLOBIN A1C
Est. average glucose Bld gHb Est-mCnc: 111
Hgb A1c MFr Bld: 5.5 % (ref 4.8–5.6)

## 2016-03-29 LAB — C-REACTIVE PROTEIN: CRP: 4 mg/L (ref 0.0–4.9)

## 2016-04-05 ENCOUNTER — Telehealth: Payer: Self-pay | Admitting: *Deleted

## 2016-04-05 ENCOUNTER — Telehealth: Payer: Self-pay

## 2016-04-05 DIAGNOSIS — E7800 Pure hypercholesterolemia, unspecified: Secondary | ICD-10-CM

## 2016-04-05 MED ORDER — ATORVASTATIN CALCIUM 20 MG PO TABS: 20.0000 mg | ORAL_TABLET | Freq: Every day | ORAL | 11 refills | Status: DC

## 2016-04-05 NOTE — Telephone Encounter (Signed)
LVM requesting call back, re: lab results.  

## 2016-04-05 NOTE — Telephone Encounter (Signed)
Called pt w/ normal cartotid duplex results per Dr. Cathren Laine request. Also reviewed lab results including elevated LDL and recommendations to start statin. Pt was agreeable. New rx e-scribed to verified pharmacy. Will re-check labs in 3 mos (future order placed) per MD.

## 2016-04-05 NOTE — Telephone Encounter (Signed)
-----   Message from Melvenia Beam, MD sent at 03/30/2016 12:51 PM EST ----- Patient's ldl is very elevated at 165, my goal for her would be 70. I do recommend a cholesterol medicine, a statin, Lipitor (atorvastatin). Please discuss with her as well as the side effects and ensure she has no intolerance from the past. We would start 20mg  at 6pm disp 30 with 12 refills and then recheck in 3 months. thanks

## 2016-04-07 ENCOUNTER — Ambulatory Visit
Admission: RE | Admit: 2016-04-07 | Discharge: 2016-04-07 | Disposition: A | Discharge status: Home / Self Care | Payer: BLUE CROSS/BLUE SHIELD | Source: Ambulatory Visit | Attending: Neurology | Admitting: Neurology

## 2016-04-07 DIAGNOSIS — H5462 Unqualified visual loss, left eye, normal vision right eye: Secondary | ICD-10-CM | POA: Diagnosis not present

## 2016-04-07 DIAGNOSIS — G458 Other transient cerebral ischemic attacks and related syndromes: Secondary | ICD-10-CM

## 2016-04-07 DIAGNOSIS — H53129 Transient visual loss, unspecified eye: Secondary | ICD-10-CM | POA: Diagnosis not present

## 2016-04-12 ENCOUNTER — Telehealth: Payer: Self-pay | Admitting: *Deleted

## 2016-04-12 ENCOUNTER — Ambulatory Visit (HOSPITAL_COMMUNITY): Payer: BLUE CROSS/BLUE SHIELD | Attending: Internal Medicine

## 2016-04-12 ENCOUNTER — Other Ambulatory Visit: Payer: Self-pay

## 2016-04-12 DIAGNOSIS — G459 Transient cerebral ischemic attack, unspecified: Secondary | ICD-10-CM | POA: Insufficient documentation

## 2016-04-12 DIAGNOSIS — G8929 Other chronic pain: Secondary | ICD-10-CM | POA: Diagnosis not present

## 2016-04-12 DIAGNOSIS — M549 Dorsalgia, unspecified: Secondary | ICD-10-CM | POA: Diagnosis not present

## 2016-04-12 NOTE — Telephone Encounter (Signed)
Per Dr Jaynee Eagles, spoke with patient and informed her that her MRI brain and MRA head are unremarkable. There is no stroke which is great news. There is an incidental finding of degenerative changes in the cervical spine. This RN inquired if she has neck pain or radicular symptoms. If so, Dr Jaynee Eagles can order an MRI cervical spine as well.  Patient stated she "is fine". She stated she may have neck pain very sporadically. She verbalized understanding, appreciation of call.

## 2016-04-13 ENCOUNTER — Telehealth: Payer: Self-pay | Admitting: Interventional Cardiology

## 2016-04-13 NOTE — Telephone Encounter (Signed)
-----   Message from Jettie Booze, MD sent at 04/12/2016 11:45 PM EST ----- Normla LV function.  No source of TIA detected.

## 2016-04-13 NOTE — Telephone Encounter (Signed)
Patient states that she is returning a call for results. She can be reached at [phone] and you may leave a voicemail. Thanks.

## 2016-04-13 NOTE — Telephone Encounter (Signed)
Detailed message of results left on self identified voicemail.

## 2016-04-16 DIAGNOSIS — M47816 Spondylosis without myelopathy or radiculopathy, lumbar region: Secondary | ICD-10-CM | POA: Diagnosis not present

## 2016-04-16 DIAGNOSIS — M5137 Other intervertebral disc degeneration, lumbosacral region: Secondary | ICD-10-CM | POA: Diagnosis not present

## 2016-04-16 DIAGNOSIS — M961 Postlaminectomy syndrome, not elsewhere classified: Secondary | ICD-10-CM | POA: Diagnosis not present

## 2016-04-23 DIAGNOSIS — M5137 Other intervertebral disc degeneration, lumbosacral region: Secondary | ICD-10-CM | POA: Diagnosis not present

## 2016-05-01 DIAGNOSIS — M5137 Other intervertebral disc degeneration, lumbosacral region: Secondary | ICD-10-CM | POA: Diagnosis not present

## 2016-06-13 DIAGNOSIS — M5137 Other intervertebral disc degeneration, lumbosacral region: Secondary | ICD-10-CM | POA: Diagnosis not present

## 2016-06-27 DIAGNOSIS — M5137 Other intervertebral disc degeneration, lumbosacral region: Secondary | ICD-10-CM | POA: Diagnosis not present

## 2016-06-27 DIAGNOSIS — M47816 Spondylosis without myelopathy or radiculopathy, lumbar region: Secondary | ICD-10-CM | POA: Diagnosis not present

## 2016-06-27 DIAGNOSIS — M961 Postlaminectomy syndrome, not elsewhere classified: Secondary | ICD-10-CM | POA: Diagnosis not present

## 2016-07-14 DIAGNOSIS — F331 Major depressive disorder, recurrent, moderate: Secondary | ICD-10-CM | POA: Diagnosis not present

## 2016-08-14 DIAGNOSIS — F331 Major depressive disorder, recurrent, moderate: Secondary | ICD-10-CM | POA: Diagnosis not present

## 2016-08-28 DIAGNOSIS — Z8673 Personal history of transient ischemic attack (TIA), and cerebral infarction without residual deficits: Secondary | ICD-10-CM | POA: Diagnosis not present

## 2016-08-28 DIAGNOSIS — K219 Gastro-esophageal reflux disease without esophagitis: Secondary | ICD-10-CM | POA: Diagnosis not present

## 2016-08-28 DIAGNOSIS — Z79899 Other long term (current) drug therapy: Secondary | ICD-10-CM | POA: Diagnosis not present

## 2016-08-28 DIAGNOSIS — E785 Hyperlipidemia, unspecified: Secondary | ICD-10-CM | POA: Diagnosis not present

## 2016-08-31 ENCOUNTER — Telehealth: Payer: Self-pay

## 2016-08-31 NOTE — Telephone Encounter (Signed)
Received faxed lab results from [facility]. CBC, CMP and lipid panel wnl except for RBC 4/03 (L). Sent to med records for scanning, copy to Dr. Jaynee Eagles for review.

## 2016-09-04 ENCOUNTER — Ambulatory Visit (INDEPENDENT_AMBULATORY_CARE_PROVIDER_SITE_OTHER): Payer: BLUE CROSS/BLUE SHIELD | Admitting: Neurology

## 2016-09-04 ENCOUNTER — Encounter: Payer: Self-pay | Admitting: Neurology

## 2016-09-04 VITALS — BP 113/83 | HR 93 | Ht 65.0 in | Wt 165.8 lb

## 2016-09-04 DIAGNOSIS — G609 Hereditary and idiopathic neuropathy, unspecified: Secondary | ICD-10-CM | POA: Diagnosis not present

## 2016-09-04 MED ORDER — GABAPENTIN 300 MG PO CAPS: 300.0000 mg | ORAL_CAPSULE | Freq: Three times a day (TID) | ORAL | 11 refills | Status: DC

## 2016-09-04 NOTE — Progress Notes (Signed)
[facility]    Provider:  Dr Jaynee Eagles Primary Care Physician:  Starlyn Skeans, PA-C  CC:  TIA. Neuropathy  Interval history 09/04/2016: She was diagnosed with neuropathy. Started 10 years ago. She saw rheumatology and neuropathy. She has chronic back pain and has been to orthopaedics, pain management, surgery, shots. The neuropathy pain in the feet is worsening. She takes tramadol for her pain. She also has pain in her knees for arthritis. At night the pain is worse, electrical currents through the feet and lower legs, she also has numbness in the feet, during the day is aching. She sometimes misses steps. She is having balance difficulties and weakness in her legs. She has to use her hands to get out of a low seat. She has followed with rheumatology. She walks as exercise. Legs hurt after she walks now. Gabapentin has helped. She has fibromyalgia and has pain "24x7". Neuropathy runs in the family. Brother and father and sister and paternal uncle with pain in the feet. Patient declined further imaging of the cervical spine due to degenerative findings on MRI of the brain.  MRI brain: This MRI of the brain without contrast shows the following: 1.   Brain parenchyma appears normal. 2.   Degenerative changes are noted in the upper cervical spine with mild anterolisthesis of C2 upon C3, severe loss of disc height at C3-C4 and moderate loss of disc height at C4-C5. 3.   Fluid is noted within the mastoid air cells on the right most consistent with eustachian tube dysfunction.   4.   No acute findings.  MRA head: IMPRESSION:  This is a normal MR angiogram of the intracerebral arteries. Incidental note is made of a fetal origin of the left posterior cerebral artery, a normal variant.  HPI:  Rebecca Rose is a 61 y.o. female here as a referral from Dr. No ref. provider found for blurred vision and vision loss of left eye. Past medical history osteoarthritis, fibromyalgia,posterior  vitreous detachment both eyes, migraines. Patient reports an episode of painless, complete loss of vision left eye in January which lasted about 3 minutes and self resolved. Has not recurred. Ophthalmologist does not feel this episode is explained by eye findings. Suggested evaluation for TIA or stroke.Happened in January, she was walking to the cafeteria and all of a sudden some pulled a curtain over her left eye, it was completely black, couldn;t see light or shaows. Lasted a minute and went away. She continued to have blurred vision and headaches. The blurred vision is betetr, headaches improved. The left eye stayed blurred for 3 weeks and daily headaches. Still with headaches occasionally. No pain on eye movement. The headache is an ache on the right. She has a history of migraines last one 22 years ago. She is not on a baby aspirin. No other focal neurologic deficits, associated symptoms, inciting events or modifiable factors. Echo showed grade 1 diastolic dysfunction and she was asked to follow up with primary care, but no thrombus or causes for TIA seen on echocardiogram.   Reviewed notes, labs and imaging from outside physicians, which showed: Patient was  The emergency room by her ophthalmology 03/09/2016 for blurred vision and increase headache for 2 weeks. Has been ongoing or 2 weeks.She complained of left eye pain and headache. 3 weeks prior she had an episode of painless loss of vision to her left eye that lasted about 3 minutes then resolved spontaneously. Since then she's had blurry vision in her left eye pain.  She was seen by her ophthalmologist within normal workup. Normal high pressures. Needs TIA workup. She was sent to the emergency room for stroke workup.  Reviewed ophthalmology notes.Acuity in the right eye 20/25 acuity in the left eye 20/25. External exam normal, slit lamp exam normal, funduscopic exam normal, visual field interpretation normal. Anatomical narrow angle borderline  glaucoma both eyes  US Carotid: No hemodynamically significant plaque seen.  Echo: - Left ventricle: The cavity size was normal. Wall thickness was   normal. Systolic function was normal. The estimated ejection   fraction was in the range of 60% to 65%. Doppler parameters are   consistent with abnormal left ventricular relaxation (grade 1   diastolic dysfunction).   CT head 03/08/2016 showed No acute intracranial abnormalities including mass lesion or mass effect, hydrocephalus, extra-axial fluid collection, midline shift, hemorrhage, or acute infarction, large ischemic events (personally reviewed images)     Review of Systems: Patient complains of symptoms per HPI as well as the following symptoms: fatigue, blurred vision, headache, weakness, joint pain. Pertinent negatives per HPI. All others negative.  Social History   Social History  . Marital status: Married    Spouse name: N/A  . Number of children: N/A  . Years of education: N/A   Occupational History  . Art gallery manager    Social History Main Topics  . Smoking status: Never Smoker  . Smokeless tobacco: Never Used  . Alcohol use No  . Drug use: No  . Sexual activity: Not on file   Other Topics Concern  . Not on file   Social History Narrative   Lives at home w/ her husband   Right-caffeine   Caffeine: 1 large cup of coffee per day    Family History  Problem Relation Age of Onset  . Transient ischemic attack Father     Past Medical History:  Diagnosis Date  . Cataracts, bilateral   . Fibroleiomyoma   . Fibromyalgia   . Glaucoma   . History of stomach ulcers   . Neuropathy of leg   . Vision abnormalities     Past Surgical History:  Procedure Laterality Date  . bone spur toe left foot    . carpal tunnel both hands  2005-2006  . fusion lower back  11/2008  . fusion lower back  10/1998  . fusion to lower back  11/2010  . knee arthoscopy  1995/2000   five on right leg  . ulkna release right   2006    Current Outpatient Prescriptions  Medication Sig Dispense Refill  . acetaminophen (TYLENOL) 650 MG CR tablet Take 650 mg by mouth 2 (two) times daily.     . Ascorbic Acid (VITAMIN C) 1000 MG tablet Take 1,000 mg by mouth daily.    Marland Kitchen aspirin EC 81 MG tablet Take 81 mg by mouth daily.    Marland Kitchen atorvastatin (LIPITOR) 20 MG tablet Take 1 tablet (20 mg total) by mouth daily. 30 tablet 11  . Cholecalciferol (VITAMIN D3) 5000 units CAPS Take 5,000 Units by mouth daily.    Marland Kitchen loratadine (CLARITIN) 10 MG tablet Take 10 mg by mouth daily.    Marland Kitchen OMEPRAZOLE PO Take 40 mg by mouth.    . traMADol (ULTRAM-ER) 200 MG 24 hr tablet Take 200 mg by mouth daily.     Marland Kitchen UNABLE TO FIND Take 2 tablets by mouth daily. hylands leg cramps 2 in am    . vitamin B-12 (CYANOCOBALAMIN) 1000 MCG tablet Take 1,000 mcg by  mouth daily.     Marland Kitchen gabapentin (NEURONTIN) 300 MG capsule Take 1 capsule (300 mg total) by mouth 3 (three) times daily. 90 capsule 11   No current facility-administered medications for this visit.     Allergies as of 09/04/2016 - Review Complete 09/04/2016  Allergen Reaction Noted  . Nsaids Other (See Comments) 03/20/2016    Vitals: BP 113/83   Pulse 93   Ht _0  (1.651 m)   Wt 165 lb 12.8 oz (75.2 kg)   BMI 27.59 kg/m  Last Weight:  Wt Readings from Last 1 Encounters:  09/04/16 165 lb 12.8 oz (75.2 kg)   Last Height:   Ht Readings from Last 1 Encounters:  09/04/16 _1  (1.651 m)   Physical exam: Exam: Gen: NAD, conversant, well nourised, obese, well groomed                     CV: RRR, no MRG. No Carotid Bruits. No peripheral edema, warm, nontender Eyes: Conjunctivae clear without exudates or hemorrhage  Neuro: Detailed Neurologic Exam  Speech:    Speech is normal; fluent and spontaneous with normal comprehension.  Cognition:    The patient is oriented to person, place, and time;     recent and remote memory intact;     language fluent;     normal attention, concentration,      fund of knowledge Cranial Nerves:    The pupils are equal, round, and reactive to light. The fundi are normal and spontaneous venous pulsations are present. Visual fields are full to finger confrontation. Extraocular movements are intact. Trigeminal sensation is intact and the muscles of mastication are normal. The face is symmetric. The palate elevates in the midline. Hearing intact. Voice is normal. Shoulder shrug is normal. The tongue has normal motion without fasciculations.   Motor Observation:    No asymmetry, no atrophy, and no involuntary movements noted. Tone:    Normal muscle tone.    Posture:    Posture is normal. normal erect    Strength:    Strength is V/V in the upper and lower limbs.      Sensation: intact to LT, pin prick, temperature, vibration as well as proprioception distally in the limbs     Reflex Exam:  DTR's:    Deep tendon reflexes in the upper and lower extremities are normal bilaterally.   Toes:    The toes are downgoing bilaterally.   Clonus:    Clonus is absent.    Assessment/Plan:   61 y.o. female here for follow up of  blurred vision and vision loss of left eye and neuropathy in the feet. Past medical history osteoarthritis, fibromyalgia,posterior vitreous detachment both eyes, migraines. Patient reported an episode of painless, complete loss of vision left eye in January which lasted about 3 minutes and self resolved. Today she is here to discuss ongoing neuropathic pain in her feet for which she has been evaluated by neurology and rheumatology in the past. Patient reports pain in the feet and neuropathic symptoms however sensory exam is completely normal distally in the feet including deep tendon reflexes, sensation to all modalities.  Distal peripheral polyneuropathy in the feet:    -Patient reports pain in the feet and neuropathic symptoms however sensory exam is completely normal distally in the feet including deep tendon reflexes, sensation to  all modalities. Since she has already been evaluated by rheumatology will not repeat testing for rheumatologic disorder such as Sjogren's or lupus. We'll check  few other serum neuropathy labs as below.Discussed the causes of peripheral neuropathy, the most common being diabetes which patient does not have. About 20 million people in the [place] states have some form of peripheral neuropathy. This is a condition that develops as a result of damage to the peripheral nervous system. There are multiple causes eluding metabolic, toxic, infectious and endocrine disorders, small vessel disease, autoimmune diseases, and others. A large portion of neuropathies are idiopathic. Do not feel EMG nerve conduction study is indicated at this time.  - Continue gabapentin for pain relief  Orders Placed This Encounter  Procedures  . TSH  . B12 and Folate Panel  . Heavy metals, blood  . Vitamin B6  . Multiple Myeloma Panel (SPEP&IFE w/QIG)  . Vitamin B1  . Methylmalonic acid, serum     TIA:   - A complete stroke workup including imaging and labs, also esr and crp for temporal arteritis, MRI/MRA, echo and carotid dopplers were unremarkable  ldl 89, goal is 70 hgba1c 5.5  I had a long d/w patient about her recent TIA , risk for recurrent stroke/TIAs, personally independently reviewed imaging studies and stroke evaluation results and answered questions.Continue ASA for secondary stroke prevention and maintain strict control of hypertension with blood pressure goal below 130/90, diabetes with hemoglobin A1c goal below 6.5% and lipids with LDL cholesterol goal below 70 mg/dL. I also advised the patient to eat a healthy diet with plenty of whole grains, cereals, fruits and vegetables, exercise regularly and maintain ideal body weight .  Cc: Starlyn Skeans, PA-C   Sarina Ill, MD  [facility] [address] [place], [place] [address]  Phone [phone] Fax  [phone]  A total of 30 minutes was spent face-to-face with this patient. Over half this time was spent on counseling patient on the distal peripheral idiopathic polyneuropathy diagnosis and different diagnostic and therapeutic options available.

## 2016-09-04 NOTE — Patient Instructions (Addendum)
Remember to drink plenty of fluid, eat healthy meals and do not skip any meals. Try to eat protein with a every meal and eat a healthy snack such as fruit or nuts in between meals. Try to keep a regular sleep-wake schedule and try to exercise daily, particularly in the form of walking, 20-30 minutes a day, if you can.   As far as your medications are concerned, I would like to suggest: Gabapentin 300 mg up to 3 times a day  As far as diagnostic testing:  Labs  I would like to see you back in 6 months, sooner if we need to. Please call us with any interim questions, concerns, problems, updates or refill requests.   Our phone number is [phone]. We also have an after hours call service for urgent matters and there is a physician on-call for urgent questions. For any emergencies you know to call 911 or go to the nearest emergency room   Neuropathic Pain Neuropathic pain is pain caused by damage to the nerves that are responsible for certain sensations in your body (sensory nerves). The pain can be caused by damage to:  The sensory nerves that send signals to your spinal cord and brain (peripheral nervous system).  The sensory nerves in your brain or spinal cord (central nervous system).  Neuropathic pain can make you more sensitive to pain. What would be a minor sensation for most people may feel very painful if you have neuropathic pain. This is usually a long-term condition that can be difficult to treat. The type of pain can differ from person to person. It may start suddenly (acute), or it may develop slowly and last for a long time (chronic). Neuropathic pain may come and go as damaged nerves heal or may stay at the same level for years. It often causes emotional distress, loss of sleep, and a lower quality of life. What are the causes? The most common cause of damage to a sensory nerve is diabetes. Many other diseases and conditions can also cause neuropathic pain. Causes of neuropathic  pain can be classified as:  Toxic. Many drugs and chemicals can cause toxic damage. The most common cause of toxic neuropathic pain is damage from drug treatment for cancer (chemotherapy).  Metabolic. This type of pain can happen when a disease causes imbalances that damage nerves. Diabetes is the most common of these diseases. Vitamin B deficiency caused by long-term alcohol abuse is another common cause.  Traumatic. Any injury that cuts, crushes, or stretches a nerve can cause damage and pain. A common example is feeling pain after losing an arm or leg (phantom limb pain).  Compression-related. If a sensory nerve gets trapped or compressed for a long period of time, the blood supply to the nerve can be cut off.  Vascular. Many blood vessel diseases can cause neuropathic pain by decreasing blood supply and oxygen to nerves.  Autoimmune. This type of pain results from diseases in which the body's defense system mistakenly attacks sensory nerves. Examples of autoimmune diseases that can cause neuropathic pain include lupus and multiple sclerosis.  Infectious. Many types of viral infections can damage sensory nerves and cause pain. Shingles infection is a common cause of this type of pain.  Inherited. Neuropathic pain can be a symptom of many diseases that are passed down through families (genetic).  What are the signs or symptoms? The main symptom is pain. Neuropathic pain is often described as:  Burning.  Shock-like.  Stinging.  Hot or  cold.  Itching.  How is this diagnosed? No single test can diagnose neuropathic pain. Your health care provider will do a physical exam and ask you about your pain. You may use a pain scale to describe how bad your pain is. You may also have tests to see if you have a high sensitivity to pain and to help find the cause and location of any sensory nerve damage. These tests may include:  Imaging studies, such as: ? X-rays. ? CT scan. ? MRI.  Nerve  conduction studies to test how well nerve signals travel through your sensory nerves (electrodiagnostic testing).  Stimulating your sensory nerves through electrodes on your skin and measuring the response in your spinal cord and brain (somatosensory evoked potentials).  How is this treated? Treatment for neuropathic pain may change over time. You may need to try different treatment options or a combination of treatments. Some options include:  Over-the-counter pain relievers.  Prescription medicines. Some medicines used to treat other conditions may also help neuropathic pain. These include medicines to: ? Control seizures (anticonvulsants). ? Relieve depression (antidepressants).  Prescription-strength pain relievers (narcotics). These are usually used when other pain relievers do not help.  Transcutaneous nerve stimulation (TENS). This uses electrical currents to block painful nerve signals. The treatment is painless.  Topical and local anesthetics. These are medicines that numb the nerves. They can be injected as a nerve block or applied to the skin.  Alternative treatments, such as: ? Acupuncture. ? Meditation. ? Massage. ? Physical therapy. ? Pain management programs. ? Counseling.  Follow these instructions at home:  Learn as much as you can about your condition.  Take medicines only as directed by your health care provider.  Work closely with all your health care providers to find what works best for you.  Have a good support system at home.  Consider joining a chronic pain support group. Contact a health care provider if:  Your pain treatments are not helping.  You are having side effects from your medicines.  You are struggling with fatigue, mood changes, depression, or anxiety. This information is not intended to replace advice given to you by your health care provider. Make sure you discuss any questions you have with your health care provider. Document  Released: 10/20/2003 Document Revised: 08/12/2015 Document Reviewed: 07/02/2013 Elsevier Interactive Patient Education  2018 [employer].  Gabapentin capsules or tablets What is this medicine? GABAPENTIN (GA ba pen tin) is used to control partial seizures in adults with epilepsy. It is also used to treat certain types of nerve pain. This medicine may be used for other purposes; ask your health care provider or pharmacist if you have questions. COMMON BRAND NAME(S): Active-PAC with Gabapentin, Gabarone, Neurontin What should I tell my health care provider before I take this medicine? They need to know if you have any of these conditions: -kidney disease -suicidal thoughts, plans, or attempt; a previous suicide attempt by you or a family member -an unusual or allergic reaction to gabapentin, other medicines, foods, dyes, or preservatives -pregnant or trying to get pregnant -breast-feeding How should I use this medicine? Take this medicine by mouth with a glass of water. Follow the directions on the prescription label. You can take it with or without food. If it upsets your stomach, take it with food.Take your medicine at regular intervals. Do not take it more often than directed. Do not stop taking except on your doctor's advice. If you are directed to break the 600 or  800 mg tablets in half as part of your dose, the extra half tablet should be used for the next dose. If you have not used the extra half tablet within 28 days, it should be thrown away. A special MedGuide will be given to you by the pharmacist with each prescription and refill. Be sure to read this information carefully each time. Talk to your pediatrician regarding the use of this medicine in children. Special care may be needed. Overdosage: If you think you have taken too much of this medicine contact a poison control center or emergency room at once. NOTE: This medicine is only for you. Do not share this medicine with  others. What if I miss a dose? If you miss a dose, take it as soon as you can. If it is almost time for your next dose, take only that dose. Do not take double or extra doses. What may interact with this medicine? Do not take this medicine with any of the following medications: -other gabapentin products This medicine may also interact with the following medications: -alcohol -antacids -antihistamines for allergy, cough and cold -certain medicines for anxiety or sleep -certain medicines for depression or psychotic disturbances -homatropine; hydrocodone -naproxen -narcotic medicines (opiates) for pain -phenothiazines like chlorpromazine, mesoridazine, prochlorperazine, thioridazine This list may not describe all possible interactions. Give your health care provider a list of all the medicines, herbs, non-prescription drugs, or dietary supplements you use. Also tell them if you smoke, drink alcohol, or use illegal drugs. Some items may interact with your medicine. What should I watch for while using this medicine? Visit your doctor or health care professional for regular checks on your progress. You may want to keep a record at home of how you feel your condition is responding to treatment. You may want to share this information with your doctor or health care professional at each visit. You should contact your doctor or health care professional if your seizures get worse or if you have any new types of seizures. Do not stop taking this medicine or any of your seizure medicines unless instructed by your doctor or health care professional. Stopping your medicine suddenly can increase your seizures or their severity. Wear a medical identification bracelet or chain if you are taking this medicine for seizures, and carry a card that lists all your medications. You may get drowsy, dizzy, or have blurred vision. Do not drive, use machinery, or do anything that needs mental alertness until you know how  this medicine affects you. To reduce dizzy or fainting spells, do not sit or stand up quickly, especially if you are an older patient. Alcohol can increase drowsiness and dizziness. Avoid alcoholic drinks. Your mouth may get dry. Chewing sugarless gum or sucking hard candy, and drinking plenty of water will help. The use of this medicine may increase the chance of suicidal thoughts or actions. Pay special attention to how you are responding while on this medicine. Any worsening of mood, or thoughts of suicide or dying should be reported to your health care professional right away. Women who become pregnant while using this medicine may enroll in the [place] Pregnancy Registry by calling [phone]. This registry collects information about the safety of antiepileptic drug use during pregnancy. What side effects may I notice from receiving this medicine? Side effects that you should report to your doctor or health care professional as soon as possible: -allergic reactions like skin rash, itching or hives, swelling of the face, lips,  or tongue -worsening of mood, thoughts or actions of suicide or dying Side effects that usually do not require medical attention (report to your doctor or health care professional if they continue or are bothersome): -constipation -difficulty walking or controlling muscle movements -dizziness -nausea -slurred speech -tiredness -tremors -weight gain This list may not describe all possible side effects. Call your doctor for medical advice about side effects. You may report side effects to FDA at 1-800-FDA-1088. Where should I keep my medicine? Keep out of reach of children. This medicine may cause accidental overdose and death if it taken by other adults, children, or pets. Mix any unused medicine with a substance like cat litter or coffee grounds. Then throw the medicine away in a sealed container like a sealed bag or a coffee can with a  lid. Do not use the medicine after the expiration date. Store at room temperature between 15 and 30 degrees C (59 and 86 degrees F). NOTE: This sheet is a summary. It may not cover all possible information. If you have questions about this medicine, talk to your doctor, pharmacist, or health care provider.  2018 Elsevier/Gold Standard (2013-03-20 15:26:50)

## 2016-09-06 DIAGNOSIS — F331 Major depressive disorder, recurrent, moderate: Secondary | ICD-10-CM | POA: Diagnosis not present

## 2016-09-08 LAB — HEAVY METALS, BLOOD
ARSENIC: 5 ug/L (ref 2–23)
LEAD, BLOOD: 1 ug/dL (ref 0–19)
MERCURY: NOT DETECTED ug/L (ref 0.0–14.9)

## 2016-09-08 LAB — MULTIPLE MYELOMA PANEL, SERUM
ALBUMIN SERPL ELPH-MCNC: 4.8 g/dL — AB (ref 2.9–4.4)
ALPHA 1: 0.3 g/dL (ref 0.0–0.4)
ALPHA2 GLOB SERPL ELPH-MCNC: 0.7 g/dL (ref 0.4–1.0)
Albumin/Glob SerPl: 1.7 (ref 0.7–1.7)
B-Globulin SerPl Elph-Mcnc: 1.1 g/dL (ref 0.7–1.3)
Gamma Glob SerPl Elph-Mcnc: 1 g/dL (ref 0.4–1.8)
Globulin, Total: 3 g/dL (ref 2.2–3.9)
IGG (IMMUNOGLOBIN G), SERUM: 1055 mg/dL (ref 700–1600)
IGM (IMMUNOGLOBULIN M), SRM: 71 mg/dL (ref 26–217)
IgA/Immunoglobulin A, Serum: 181 mg/dL (ref 87–352)
TOTAL PROTEIN: 7.8 g/dL (ref 6.0–8.5)

## 2016-09-08 LAB — VITAMIN B1: Thiamine: 116.3 nmol/L (ref 66.5–200.0)

## 2016-09-08 LAB — VITAMIN B6

## 2016-09-08 LAB — B12 AND FOLATE PANEL
Folate: 13 ng/mL (ref >3.0)
Vitamin B-12: >2000 pg/mL — ABNORMAL HIGH (ref 232–1245)

## 2016-09-08 LAB — METHYLMALONIC ACID, SERUM: METHYLMALONIC ACID: 83 nmol/L (ref 0–378)

## 2016-09-08 LAB — TSH: TSH: 10.13 u[IU]/mL — ABNORMAL HIGH (ref 0.450–4.500)

## 2016-09-20 ENCOUNTER — Encounter (HOSPITAL_COMMUNITY): Payer: Self-pay | Admitting: Emergency Medicine

## 2016-09-20 ENCOUNTER — Emergency Department (HOSPITAL_COMMUNITY): Payer: BLUE CROSS/BLUE SHIELD

## 2016-09-20 ENCOUNTER — Observation Stay (HOSPITAL_COMMUNITY): Payer: BLUE CROSS/BLUE SHIELD

## 2016-09-20 ENCOUNTER — Telehealth: Payer: Self-pay | Admitting: Neurology

## 2016-09-20 ENCOUNTER — Observation Stay (HOSPITAL_COMMUNITY)
Admission: EM | Admit: 2016-09-20 | Discharge: 2016-09-21 | Disposition: A | Discharge status: Home / Self Care | Payer: BLUE CROSS/BLUE SHIELD | Attending: Internal Medicine | Admitting: Internal Medicine

## 2016-09-20 DIAGNOSIS — R030 Elevated blood-pressure reading, without diagnosis of hypertension: Secondary | ICD-10-CM | POA: Diagnosis not present

## 2016-09-20 DIAGNOSIS — R51 Headache: Secondary | ICD-10-CM | POA: Diagnosis not present

## 2016-09-20 DIAGNOSIS — Z8673 Personal history of transient ischemic attack (TIA), and cerebral infarction without residual deficits: Secondary | ICD-10-CM | POA: Insufficient documentation

## 2016-09-20 DIAGNOSIS — M797 Fibromyalgia: Secondary | ICD-10-CM | POA: Insufficient documentation

## 2016-09-20 DIAGNOSIS — G458 Other transient cerebral ischemic attacks and related syndromes: Secondary | ICD-10-CM | POA: Diagnosis not present

## 2016-09-20 DIAGNOSIS — Z7982 Long term (current) use of aspirin: Secondary | ICD-10-CM | POA: Insufficient documentation

## 2016-09-20 DIAGNOSIS — Z888 Allergy status to other drugs, medicaments and biological substances status: Secondary | ICD-10-CM | POA: Insufficient documentation

## 2016-09-20 DIAGNOSIS — R42 Dizziness and giddiness: Secondary | ICD-10-CM | POA: Diagnosis not present

## 2016-09-20 DIAGNOSIS — G459 Transient cerebral ischemic attack, unspecified: Secondary | ICD-10-CM | POA: Diagnosis not present

## 2016-09-20 DIAGNOSIS — G43809 Other migraine, not intractable, without status migrainosus: Principal | ICD-10-CM | POA: Insufficient documentation

## 2016-09-20 DIAGNOSIS — G629 Polyneuropathy, unspecified: Secondary | ICD-10-CM | POA: Insufficient documentation

## 2016-09-20 DIAGNOSIS — Z79899 Other long term (current) drug therapy: Secondary | ICD-10-CM | POA: Insufficient documentation

## 2016-09-20 DIAGNOSIS — F419 Anxiety disorder, unspecified: Secondary | ICD-10-CM | POA: Diagnosis not present

## 2016-09-20 DIAGNOSIS — G4489 Other headache syndrome: Secondary | ICD-10-CM | POA: Diagnosis not present

## 2016-09-20 DIAGNOSIS — G450 Vertebro-basilar artery syndrome: Secondary | ICD-10-CM | POA: Diagnosis not present

## 2016-09-20 LAB — URINALYSIS, ROUTINE W REFLEX MICROSCOPIC
BACTERIA UA: NONE SEEN
BILIRUBIN URINE: NEGATIVE
Glucose, UA: NEGATIVE mg/dL
HGB URINE DIPSTICK: NEGATIVE
KETONES UR: NEGATIVE mg/dL
NITRITE: NEGATIVE
PROTEIN: NEGATIVE mg/dL
Specific Gravity, Urine: 1.004 — ABNORMAL LOW (ref 1.005–1.030)
Squamous Epithelial / LPF: NONE SEEN
pH: 8 (ref 5.0–8.0)

## 2016-09-20 LAB — COMPREHENSIVE METABOLIC PANEL
ALBUMIN: 4.3 g/dL (ref 3.5–5.0)
ALK PHOS: 62 U/L (ref 38–126)
ALT: 18 U/L (ref 14–54)
ANION GAP: 8 (ref 5–15)
AST: 30 U/L (ref 15–41)
BILIRUBIN TOTAL: 0.7 mg/dL (ref 0.3–1.2)
BUN: 10 mg/dL (ref 6–20)
CALCIUM: 9.4 mg/dL (ref 8.9–10.3)
CO2: 27 mmol/L (ref 22–32)
Chloride: 105 mmol/L (ref 101–111)
Creatinine, Ser: 0.79 mg/dL (ref 0.44–1.00)
GFR calc Af Amer: >60 mL/min (ref >60)
Glucose, Bld: 82 mg/dL (ref 65–99)
Potassium: 3.8 mmol/L (ref 3.5–5.1)
Sodium: 140 mmol/L (ref 135–145)
TOTAL PROTEIN: 7.1 g/dL (ref 6.5–8.1)

## 2016-09-20 LAB — CBC
HEMATOCRIT: 36.6 % (ref 36.0–46.0)
HEMOGLOBIN: 12.3 g/dL (ref 12.0–15.0)
MCH: 31.3 pg (ref 26.0–34.0)
MCHC: 33.6 g/dL (ref 30.0–36.0)
MCV: 93.1 fL (ref 78.0–100.0)
Platelets: 282 10*3/uL (ref 150–400)
RBC: 3.93 MIL/uL (ref 3.87–5.11)
RDW: 13.9 % (ref 11.5–15.5)
WBC: 4.9 10*3/uL (ref 4.0–10.5)

## 2016-09-20 LAB — DIFFERENTIAL
Basophils Absolute: 0 10*3/uL (ref 0.0–0.1)
Basophils Relative: 0 %
EOS PCT: 3 %
Eosinophils Absolute: 0.1 10*3/uL (ref 0.0–0.7)
LYMPHS ABS: 1.1 10*3/uL (ref 0.7–4.0)
LYMPHS PCT: 22 %
MONOS PCT: 7 %
Monocytes Absolute: 0.4 10*3/uL (ref 0.1–1.0)
NEUTROS PCT: 68 %
Neutro Abs: 3.4 10*3/uL (ref 1.7–7.7)

## 2016-09-20 LAB — I-STAT CHEM 8, ED
BUN: 10 mg/dL (ref 6–20)
CHLORIDE: 102 mmol/L (ref 101–111)
Calcium, Ion: 1.22 mmol/L (ref 1.15–1.40)
Creatinine, Ser: 0.8 mg/dL (ref 0.44–1.00)
Glucose, Bld: 78 mg/dL (ref 65–99)
HEMATOCRIT: 39 % (ref 36.0–46.0)
Hemoglobin: 13.3 g/dL (ref 12.0–15.0)
POTASSIUM: 3.7 mmol/L (ref 3.5–5.1)
SODIUM: 141 mmol/L (ref 135–145)
TCO2: 29 mmol/L (ref 0–100)

## 2016-09-20 LAB — RAPID URINE DRUG SCREEN, HOSP PERFORMED
AMPHETAMINES: NOT DETECTED
Barbiturates: NOT DETECTED
Benzodiazepines: POSITIVE — AB
Cocaine: NOT DETECTED
OPIATES: NOT DETECTED
Tetrahydrocannabinol: NOT DETECTED

## 2016-09-20 LAB — I-STAT TROPONIN, ED: Troponin i, poc: 0 ng/mL (ref 0.00–0.08)

## 2016-09-20 LAB — CBG MONITORING, ED: Glucose-Capillary: 72 mg/dL (ref 65–99)

## 2016-09-20 LAB — SEDIMENTATION RATE: SED RATE: 21 mm/h (ref 0–22)

## 2016-09-20 LAB — C-REACTIVE PROTEIN

## 2016-09-20 LAB — PROTIME-INR
INR: 0.96
Prothrombin Time: 12.8 seconds (ref 11.4–15.2)

## 2016-09-20 MED ORDER — ROSUVASTATIN CALCIUM 10 MG PO TABS
10.0000 mg | ORAL_TABLET | Freq: Every day | ORAL | Status: DC
  Filled 2016-09-20: qty 1

## 2016-09-20 MED ORDER — STROKE: EARLY STAGES OF RECOVERY BOOK
Freq: Once | Status: DC
  Filled 2016-09-20: qty 1

## 2016-09-20 MED ORDER — VITAMIN C 500 MG PO TABS
1000.0000 mg | ORAL_TABLET | Freq: Every day | ORAL | Status: DC
  Administered 2016-09-21: 1000 mg via ORAL
  Filled 2016-09-20: qty 2

## 2016-09-20 MED ORDER — TRAMADOL HCL ER 200 MG PO TB24: 200.0000 mg | ORAL_TABLET | Freq: Every evening | ORAL | Status: DC

## 2016-09-20 MED ORDER — ACETAMINOPHEN ER 650 MG PO TBCR: 650.0000 mg | EXTENDED_RELEASE_TABLET | Freq: Four times a day (QID) | ORAL | Status: DC | PRN

## 2016-09-20 MED ORDER — ENOXAPARIN SODIUM 40 MG/0.4ML ~~LOC~~ SOLN
40.0000 mg | SUBCUTANEOUS | Status: DC
  Administered 2016-09-20: 40 mg via SUBCUTANEOUS
  Filled 2016-09-20: qty 0.4

## 2016-09-20 MED ORDER — SODIUM CHLORIDE 0.9 % IV SOLN: INTRAVENOUS | Status: AC

## 2016-09-20 MED ORDER — ACETAMINOPHEN 325 MG PO TABS
650.0000 mg | ORAL_TABLET | Freq: Four times a day (QID) | ORAL | Status: DC | PRN
  Administered 2016-09-20 – 2016-09-21 (×2): 650 mg via ORAL
  Filled 2016-09-20 (×2): qty 2

## 2016-09-20 MED ORDER — LORATADINE 10 MG PO TABS
10.0000 mg | ORAL_TABLET | Freq: Every day | ORAL | Status: DC
  Administered 2016-09-21: 10 mg via ORAL
  Filled 2016-09-20: qty 1

## 2016-09-20 MED ORDER — GABAPENTIN 300 MG PO CAPS
300.0000 mg | ORAL_CAPSULE | Freq: Three times a day (TID) | ORAL | Status: DC
  Administered 2016-09-20: 300 mg via ORAL
  Filled 2016-09-20 (×2): qty 1

## 2016-09-20 MED ORDER — FAMOTIDINE 20 MG PO TABS
10.0000 mg | ORAL_TABLET | Freq: Two times a day (BID) | ORAL | Status: DC
  Administered 2016-09-20 – 2016-09-21 (×2): 10 mg via ORAL
  Filled 2016-09-20 (×2): qty 1

## 2016-09-20 MED ORDER — TRAMADOL HCL 50 MG PO TABS
50.0000 mg | ORAL_TABLET | Freq: Four times a day (QID) | ORAL | Status: DC | PRN
  Administered 2016-09-20: 50 mg via ORAL
  Filled 2016-09-20 (×2): qty 1

## 2016-09-20 MED ORDER — SENNOSIDES-DOCUSATE SODIUM 8.6-50 MG PO TABS: 1.0000 | ORAL_TABLET | Freq: Every evening | ORAL | Status: DC | PRN

## 2016-09-20 MED ORDER — VITAMIN D 1000 UNITS PO TABS
5000.0000 [IU] | ORAL_TABLET | Freq: Every day | ORAL | Status: DC
  Administered 2016-09-21: 5000 [IU] via ORAL
  Filled 2016-09-20: qty 5

## 2016-09-20 MED ORDER — LORAZEPAM 2 MG/ML IJ SOLN
1.0000 mg | Freq: Once | INTRAMUSCULAR | Status: AC
  Administered 2016-09-20: 1 mg via INTRAVENOUS
  Filled 2016-09-20: qty 1

## 2016-09-20 MED ORDER — ASPIRIN EC 81 MG PO TBEC: 81.0000 mg | DELAYED_RELEASE_TABLET | Freq: Every day | ORAL | Status: DC

## 2016-09-20 MED ORDER — VITAMIN B-12 1000 MCG PO TABS
1000.0000 ug | ORAL_TABLET | Freq: Every day | ORAL | Status: DC
  Administered 2016-09-21: 1000 ug via ORAL
  Filled 2016-09-20: qty 1

## 2016-09-20 MED ORDER — TRAMADOL HCL 50 MG PO TABS
100.0000 mg | ORAL_TABLET | Freq: Two times a day (BID) | ORAL | Status: DC
  Administered 2016-09-21: 50 mg via ORAL
  Filled 2016-09-20: qty 2

## 2016-09-20 NOTE — H&P (Signed)
History and Physical    Rebecca Rose JJH:[id number] DOB: 08/15/1955 DOA: 09/20/2016  PCP: Starlyn Skeans, PA-C   Patient coming from: Home  Chief Complaint: Headache, right leg numbness, vision disturbance   HPI: Rebecca Rose is a 61 y.o. female with medical history significant for anxiety, TIA, peripheral neuropathy, and remote history of migraines, presenting to the ED for evaluation of headache, transient visual disturbance, and transient numbness in the right leg. Patient reports that she had been in her usual state of health until approximately 7 AM when she noted a headache. This was described as moderate in intensity, constant, and throbbing. She later stumbled while walking, attributed it to her neuropathy, but later noted an unusual numbness involving the entire right leg. She also developed a transient visual disturbance with floating objects in her visual field and difficulty seeing secondary to this. She denies any associated fevers or chills, denies abdominal pain, vomiting, or diarrhea. Denies any recent fall or trauma, and denies any recent alcohol or illicit drug use. She continues to have a mild headache upon arrival in the ED, but reports that the vision change and numbness is resolved.   ED Course: Upon arrival to the ED, patient is found to be afebrile, saturating well on room air, and with vitals otherwise stable. EKG features a normal sinus rhythm, chemistry panels unremarkable, and CBC is within normal limits. Urinalysis is unremarkable, troponin is undetectable, and UDS is positive for benzodiazepines. Noncontrast head CT is negative for acute intracranial abnormality. Neurology was consulted by the ED physician and advised a medical admission for further evaluation of possible TIA versus complex migraine.  Review of Systems:  All other systems reviewed and apart from HPI, are negative.  Past Medical History:  Diagnosis Date  . Cataracts, bilateral   .  Fibroleiomyoma   . Fibromyalgia   . Glaucoma   . History of stomach ulcers   . Neuropathy of leg   . Vision abnormalities     Past Surgical History:  Procedure Laterality Date  . bone spur toe left foot    . carpal tunnel both hands  2005-2006  . fusion lower back  11/2008  . fusion lower back  10/1998  . fusion to lower back  11/2010  . knee arthoscopy  1995/2000   five on right leg  . ulkna release right  2006     reports that she has never smoked. She has never used smokeless tobacco. She reports that she does not drink alcohol or use drugs.  Allergies  Allergen Reactions  . Nsaids Other (See Comments)    Stomach ulcers    Family History  Problem Relation Age of Onset  . Transient ischemic attack Father      Prior to Admission medications   Medication Sig Start Date End Date Taking? Authorizing Provider  acetaminophen (TYLENOL) 650 MG CR tablet Take 650 mg by mouth 2 (two) times daily.    Yes [provider]  Ascorbic Acid (VITAMIN C) 1000 MG tablet Take 1,000 mg by mouth daily.   Yes [provider]  aspirin EC 81 MG tablet Take 81 mg by mouth daily.   Yes [provider]  Cholecalciferol (VITAMIN D3) 5000 units CAPS Take 5,000 Units by mouth daily.   Yes [provider]  famotidine (PEPCID) 10 MG tablet Take 10 mg by mouth 2 (two) times daily.   Yes [provider]  gabapentin (NEURONTIN) 300 MG capsule Take 1 capsule (300 mg total) by mouth  3 (three) times daily. 09/04/16  Yes Melvenia Beam, MD  loratadine (CLARITIN) 10 MG tablet Take 10 mg by mouth daily.   Yes [provider]  rosuvastatin (CRESTOR) 10 MG tablet Take 10 mg by mouth every morning. 08/28/16  Yes [provider]  traMADol (ULTRAM) 50 MG tablet Take 50 mg by mouth every morning. 09/01/16  Yes [provider]  traMADol (ULTRAM-ER) 200 MG 24 hr tablet Take 200 mg by mouth every evening.    Yes [provider]  UNABLE TO  FIND Take 2 tablets by mouth daily. hylands leg cramps 2 in am   Yes [provider]  vitamin B-12 (CYANOCOBALAMIN) 1000 MCG tablet Take 1,000 mcg by mouth daily.    Yes [provider]    Physical Exam: Vitals:   09/20/16 1630 09/20/16 1645 09/20/16 1700 09/20/16 1715  BP: 121/81 123/84 110/83 (!) 124/97  Pulse: 81 77 65 81  Resp: (!) 24 15 16  (!) 23  Temp:      TempSrc:      SpO2: 99% 97% 98% 100%      Constitutional: NAD, calm, comfortable Eyes: PERTLA, lids and conjunctivae normal ENMT: Mucous membranes are moist. Posterior pharynx clear of any exudate or lesions.   Neck: normal, supple, no masses, no thyromegaly Respiratory: clear to auscultation bilaterally, no wheezing, no crackles. Normal respiratory effort.  Cardiovascular: S1 & S2 heard, regular rate and rhythm. No extremity edema. No significant JVD. Abdomen: No distension, no tenderness, no masses palpated. Bowel sounds normal.  Musculoskeletal: no clubbing / cyanosis. No joint deformity upper and lower extremities.   Skin: no significant rashes, lesions, ulcers. Warm, dry, well-perfused. Neurologic: CN 2-12 grossly intact. Sensation intact, DTR normal. Strength 5/5 in all 4 limbs.  Psychiatric: Alert and oriented x 3. Calm, cooperative.     Labs on Admission: I have personally reviewed following labs and imaging studies  CBC:  Recent Labs Lab 09/20/16 1135 09/20/16 1143  WBC 4.9  --   NEUTROABS 3.4  --   HGB 12.3 13.3  HCT 36.6 39.0  MCV 93.1  --   PLT 282  --    Basic Metabolic Panel:  Recent Labs Lab 09/20/16 1135 09/20/16 1143  NA 140 141  K 3.8 3.7  CL 105 102  CO2 27  --   GLUCOSE 82 78  BUN 10 10  CREATININE 0.79 0.80  CALCIUM 9.4  --    GFR: CrCl cannot be calculated (Unknown ideal weight.). Liver Function Tests:  Recent Labs Lab 09/20/16 1135  AST 30  ALT 18  ALKPHOS 62  BILITOT 0.7  PROT 7.1  ALBUMIN 4.3   No results for input(s): LIPASE, AMYLASE in the  last 168 hours. No results for input(s): AMMONIA in the last 168 hours. Coagulation Profile:  Recent Labs Lab 09/20/16 1315  INR 0.96   Cardiac Enzymes: No results for input(s): CKTOTAL, CKMB, CKMBINDEX, TROPONINI in the last 168 hours. BNP (last 3 results) No results for input(s): PROBNP in the last 8760 hours. HbA1C: No results for input(s): HGBA1C in the last 72 hours. CBG:  Recent Labs Lab 09/20/16 1558  GLUCAP 72   Lipid Profile: No results for input(s): CHOL, HDL, LDLCALC, TRIG, CHOLHDL, LDLDIRECT in the last 72 hours. Thyroid Function Tests: No results for input(s): TSH, T4TOTAL, FREET4, T3FREE, THYROIDAB in the last 72 hours. Anemia Panel: No results for input(s): VITAMINB12, FOLATE, FERRITIN, TIBC, IRON, RETICCTPCT in the last 72 hours. Urine analysis:  Component Value Date/Time   COLORURINE COLORLESS (A) 09/20/2016 1203   APPEARANCEUR CLEAR 09/20/2016 1203   LABSPEC 1.004 (L) 09/20/2016 1203   PHURINE 8.0 09/20/2016 1203   GLUCOSEU NEGATIVE 09/20/2016 1203   HGBUR NEGATIVE 09/20/2016 [place] 09/20/2016 [place] 09/20/2016 1203   PROTEINUR NEGATIVE 09/20/2016 1203   NITRITE NEGATIVE 09/20/2016 1203   LEUKOCYTESUR TRACE (A) 09/20/2016 1203   Sepsis Labs: @LABRCNTIP (procalcitonin:4,lacticidven:4) )No results found for this or any previous visit (from the past 240 hour(s)).   Radiological Exams on Admission: Ct Head Wo Contrast  Result Date: 09/20/2016 CLINICAL DATA:  Dizziness EXAM: CT HEAD WITHOUT CONTRAST TECHNIQUE: Contiguous axial images were obtained from the base of the skull through the vertex without intravenous contrast. COMPARISON:  03/08/2016 FINDINGS: Brain: No evidence of acute infarction, hemorrhage, hydrocephalus, extra-axial collection or mass lesion/mass effect. Vascular: No hyperdense vessel or unexpected calcification. Skull: Normal. Negative for fracture or focal lesion. Sinuses/Orbits: No acute  finding. Other: None. IMPRESSION: No acute intracranial abnormality noted. Electronically Signed   By: Inez Catalina M.D.   On: 09/20/2016 12:39    EKG: Independently reviewed. Sinus rhythm.   Assessment/Plan  1. Headache, vision disturbance, right-sided numbness   - Pt presents with headache, transient visual disturbance, and transient numbness involving the right leg  - Head CT is negative and no focal neurologic deficits are illicited on exam  - Neurology is consulting and much appreciated; recommending MRI and MRA now with remainder of stroke eval as outpatient if MRI/MRA negative  - Plan to continue cardiac monitoring, frequent neuro checks, and follow-up MRI brain and MRA head and neck   2. Hx of TIA  - Continue statin and ASA     DVT prophylaxis: sq Lovenox Code Status: Full  Family Communication: Discussed with patient Disposition Plan: Observe on telemetry Consults called: Neurology Admission status: Observation    Vianne Bulls, MD [facility] Pager [phone]  If 7PM-7AM, please contact night-coverage [email or web address] Password [facility]  09/20/2016, 6:16 PM

## 2016-09-20 NOTE — Consult Note (Signed)
Requesting Physician: Dr. Rex Kras    Chief Complaint: Headache with blurred vision, dizziness, right leg weakness and numbness  History obtained from:  Patient     HPI:                                                                                                                                         Rebecca Rose is an 61 y.o. female with history of migraines but has not had a migraine and approximately 30 years. Patient apparently went to work this morning and around 0700 hrs. she noted that her right leg was weak and she felt abnormal. A coworker looked at her and noted that she was apparently pale. She sat down for a little bit and then started to notice kaleidoscope like floaters that were black and blurred vision. She stood up went for a walk and noted that she continued to feel off balance and addition she started to have a throbbing headache. Patient came to the ED, symptoms resolved by 1:00.  Currently she is asymptomatic  Date last known well: Date: 09/20/2016 Time last known well: Time: 07:00 tPA Given: No: Symptoms resolved Modified Rankin: Rankin Score=0    Past Medical History:  Diagnosis Date  . Cataracts, bilateral   . Fibroleiomyoma   . Fibromyalgia   . Glaucoma   . History of stomach ulcers   . Neuropathy of leg   . Vision abnormalities     Past Surgical History:  Procedure Laterality Date  . bone spur toe left foot    . carpal tunnel both hands  2005-2006  . fusion lower back  11/2008  . fusion lower back  10/1998  . fusion to lower back  11/2010  . knee arthoscopy  1995/2000   five on right leg  . ulkna release right  2006    Family History  Problem Relation Age of Onset  . Transient ischemic attack Father    Social History:  reports that she has never smoked. She has never used smokeless tobacco. She reports that she does not drink alcohol or use drugs.  Allergies:  Allergies  Allergen Reactions  . Nsaids Other (See Comments)    Stomach ulcers     Medications:  No current facility-administered medications for this encounter.    Current Outpatient Prescriptions  Medication Sig Dispense Refill  . acetaminophen (TYLENOL) 650 MG CR tablet Take 650 mg by mouth 2 (two) times daily.     . Ascorbic Acid (VITAMIN C) 1000 MG tablet Take 1,000 mg by mouth daily.    Marland Kitchen aspirin EC 81 MG tablet Take 81 mg by mouth daily.    . Cholecalciferol (VITAMIN D3) 5000 units CAPS Take 5,000 Units by mouth daily.    . famotidine (PEPCID) 10 MG tablet Take 10 mg by mouth 2 (two) times daily.    Marland Kitchen gabapentin (NEURONTIN) 300 MG capsule Take 1 capsule (300 mg total) by mouth 3 (three) times daily. 90 capsule 11  . loratadine (CLARITIN) 10 MG tablet Take 10 mg by mouth daily.    . rosuvastatin (CRESTOR) 10 MG tablet Take 10 mg by mouth every morning.    . traMADol (ULTRAM) 50 MG tablet Take 50 mg by mouth every morning.    . traMADol (ULTRAM-ER) 200 MG 24 hr tablet Take 200 mg by mouth every evening.     Marland Kitchen UNABLE TO FIND Take 2 tablets by mouth daily. hylands leg cramps 2 in am    . vitamin B-12 (CYANOCOBALAMIN) 1000 MCG tablet Take 1,000 mcg by mouth daily.        ROS:                                                                                                                                       History obtained from the patient  General ROS: negative for - chills, fatigue, fever, night sweats, weight gain or weight loss Psychological ROS: negative for - behavioral disorder, hallucinations, memory difficulties, mood swings or suicidal ideation Ophthalmic ROS: Positive for - blurry vision,  ENT ROS: negative for - epistaxis, nasal discharge, oral lesions, sore throat, tinnitus or vertigo Allergy and Immunology ROS: negative for - hives or itchy/watery eyes Hematological and Lymphatic ROS: negative for - bleeding problems, bruising  or swollen lymph nodes Endocrine ROS: negative for - galactorrhea, hair pattern changes, polydipsia/polyuria or temperature intolerance Respiratory ROS: negative for - cough, hemoptysis, shortness of breath or wheezing Cardiovascular ROS: negative for - chest pain, dyspnea on exertion, edema or irregular heartbeat Gastrointestinal ROS: negative for - abdominal pain, diarrhea, hematemesis, nausea/vomiting or stool incontinence Genito-Urinary ROS: negative for - dysuria, hematuria, incontinence or urinary frequency/urgency Musculoskeletal ROS: negative for - joint swelling or muscular weakness Neurological ROS: as noted in HPI Dermatological ROS: negative for rash and skin lesion changes  Neurologic Examination:  Blood pressure (!) 124/97, pulse 81, temperature 97.7 F (36.5 C), temperature source Oral, resp. rate (!) 23, SpO2 100 %.  HEENT-  Normocephalic, no lesions, without obvious abnormality.  Normal external eye and conjunctiva.  Normal TM's bilaterally.  Normal auditory canals and external ears. Normal external nose, mucus membranes and septum.  Normal pharynx. Cardiovascular- S1, S2 normal, pulses palpable throughout   Lungs- chest clear, no wheezing, rales, normal symmetric air entry, Heart exam - S1, S2 normal, no murmur, no gallop, rate regular Abdomen- normal findings: bowel sounds normal Extremities- no edema Lymph-no adenopathy palpable Musculoskeletal-no joint tenderness, deformity or swelling Skin-warm and dry, no hyperpigmentation, vitiligo, or suspicious lesions  Neurological Examination Mental Status: Alert, oriented, thought content appropriate.  Speech fluent without evidence of aphasia.  Able to follow 3 step commands without difficulty. Cranial Nerves: II: Discs flat bilaterally; Visual fields grossly normal,  III,IV, VI: ptosis not present, extra-ocular motions intact  bilaterally, pupils equal, round, reactive to light and accommodation V,VII: smile symmetric, facial light touch sensation normal bilaterally VIII: hearing normal bilaterally IX,X: uvula rises symmetrically XI: bilateral shoulder shrug XII: midline tongue extension Motor: Right : Upper extremity   5/5    Left:     Upper extremity   5/5  Lower extremity   5/5     Lower extremity   5/5 Tone and bulk:normal tone throughout; no atrophy noted Sensory: Pinprick and light touch intact throughout, bilaterally Deep Tendon Reflexes: 2+ and symmetric throughout Plantars: Right: downgoing   Left: downgoing Cerebellar: normal finger-to-nose, normal rapid alternating movements and normal heel-to-shin test Gait: normal gait and station       Lab Results: Basic Metabolic Panel:  Recent Labs Lab 09/20/16 1135 09/20/16 1143  NA 140 141  K 3.8 3.7  CL 105 102  CO2 27  --   GLUCOSE 82 78  BUN 10 10  CREATININE 0.79 0.80  CALCIUM 9.4  --     Liver Function Tests:  Recent Labs Lab 09/20/16 1135  AST 30  ALT 18  ALKPHOS 62  BILITOT 0.7  PROT 7.1  ALBUMIN 4.3   No results for input(s): LIPASE, AMYLASE in the last 168 hours. No results for input(s): AMMONIA in the last 168 hours.  CBC:  Recent Labs Lab 09/20/16 1135 09/20/16 1143  WBC 4.9  --   NEUTROABS 3.4  --   HGB 12.3 13.3  HCT 36.6 39.0  MCV 93.1  --   PLT 282  --     Cardiac Enzymes: No results for input(s): CKTOTAL, CKMB, CKMBINDEX, TROPONINI in the last 168 hours.  Lipid Panel: No results for input(s): CHOL, TRIG, HDL, CHOLHDL, VLDL, LDLCALC in the last 168 hours.  CBG:  Recent Labs Lab 09/20/16 [place]    Microbiology: No results found for this or any previous visit.  Coagulation Studies:  Recent Labs  09/20/16 1315  LABPROT 12.8  INR 0.96    Imaging: Ct Head Wo Contrast  Result Date: 09/20/2016 CLINICAL DATA:  Dizziness EXAM: CT HEAD WITHOUT CONTRAST TECHNIQUE: Contiguous  axial images were obtained from the base of the skull through the vertex without intravenous contrast. COMPARISON:  03/08/2016 FINDINGS: Brain: No evidence of acute infarction, hemorrhage, hydrocephalus, extra-axial collection or mass lesion/mass effect. Vascular: No hyperdense vessel or unexpected calcification. Skull: Normal. Negative for fracture or focal lesion. Sinuses/Orbits: No acute finding. Other: None. IMPRESSION: No acute intracranial abnormality noted. Electronically Signed   By: Inez Catalina M.D.   On: 09/20/2016  12:39       Assessment and plan discussed with with attending physician and they are in agreement.    Etta Quill PA-C [facility] [phone]  09/20/2016, 5:39 PM   Assessment: 61 y.o. female with sudden onset of symptoms consisting of right leg weakness/decreased sensation, difficulty with balance, blurred vision, scotoma, followed by throbbing headache. Patient has history of migraine headaches in the past however these was slightly different as they're usually over one eye. Symptoms have fully resolved. Exam is nonfocal.  Likely basilar migraine given her history and the symptoms. However cannot rule out possible TIA.  Stroke Risk Factors - HLD  Recommendations MRI Head CTA head and neck Continue ASA, statin   NEUROHOSPITALIST ADDENDUM Seen and examined the patient this AM.  The patient presents concerning for posterior circulation TIA. We absolutely need to consult at posterior circulation vasculature to rule out stenosis/occlusion. MRI head without contrast rule out a stroke. She has had imaging a few months ago, however given new symptoms I would like to obtain a CT head and neck as  is slightly better to visualize posterior circulation rather than the MRA. I would like to for her to have a Holter ideally for 14 days on discharge to look for paroxysmal atrial fibrillation.  If abive workup negative, Her symptoms could be explained to complicated  migraine - complaints of visual floaters, foggy headedness some difficulty with words but no clear symptoms of aphasia/dysarthria. She has had a headache following the episode as well. She did have a history of migraines 30 years ago.  We'll follow-up the results.  Karena Addison Tamicka Shimon MD [facility] [phone]  If 7pm to 7am, please call on call as listed on AMION.

## 2016-09-20 NOTE — ED Notes (Signed)
Walked patient to the bathroom patient did well 

## 2016-09-20 NOTE — ED Notes (Signed)
Patient transported to MRI 

## 2016-09-20 NOTE — ED Notes (Signed)
Order placed for dinner tray to come upstairs

## 2016-09-20 NOTE — ED Notes (Signed)
PA aware of patient complaints

## 2016-09-20 NOTE — Telephone Encounter (Signed)
Pt husband called for Dr Jaynee Eagles to be made aware that pt has been brought to [facility] for possible TIA, pt asked her husband to have it noted so that Dr Jaynee Eagles could be aware and that she (the pt) will call Dr Jaynee Eagles on next week .  Pt is not asking for a call back.

## 2016-09-20 NOTE — Progress Notes (Signed)
Arrived from ED. Alert and oriented. HA 5/10. Call light within reach.

## 2016-09-20 NOTE — ED Notes (Signed)
Talked to MRI, will transport patient upstairs to floor once MRI completed

## 2016-09-20 NOTE — ED Notes (Signed)
Placed patient on the monitor into a gown patient is resting

## 2016-09-20 NOTE — ED Provider Notes (Signed)
[facility] DEPT Provider Note   CSN: [id number] Arrival date & time: 09/20/16  1110     History   Chief Complaint Chief Complaint  Patient presents with  . Headache  . Blurred Vision    HPI Rebecca Rose is a 61 y.o. female history of a TIA in January 2018 who presents to the emergency department with sudden onset numbness to the right leg, blurred vision with floaters, and feeling "as if her words were jumbled". She reports that she was at work at approximately 7:30 AM this morning when she began feeling "off kilter" and states that she was walking when she tripped over nothing. She denies falling, hitting her head, or LOC. She states that she had her onsite nurse take her blood pressure, which was ~150s/110s. She was that all of her symptoms have since resolved, but she is now complaining of a non-radiating, dull, aching right temporal and retro-orbital HA. She reports a history of more frequent, similar headaches over the last 3 weeks that will last for roughly 3-4 hours before resolving spontaneously or with Tylenol. She also reports bilateral knee pain and reports her baseline is only right knee pain.  No recent tick or insect bites. No fever, chills, rash, dyspnea, CP, N/V/D, abdominal pain, tinnitus, or vision loss.  She reports that she recently returned from a 5-day trip to [place]. She was recently started on gabapentin by her neurologist for worsening bilateral neuropathy that was previously only in her bilateral lower legs, but is now spread to her feet. She reports she is also changed from Lipitor to Crestor around 3 weeks ago.  Started on 61m ASA. MRI/MRA normal. UKoreacarotid no hemodynamically significant plaques seen. ECHO EF 60-65%. ESR/CRP normal.    Cardiology: Dr. VIrish LackNeurology: Dr. AJaynee Eagles The history is provided by the patient. No language interpreter was used.    Past Medical History:  Diagnosis Date  . Cataracts, bilateral   . Fibroleiomyoma   .  Fibromyalgia   . Glaucoma   . History of stomach ulcers   . Neuropathy of leg   . Vision abnormalities     Patient Active Problem List   Diagnosis Date Noted  . TIA (transient ischemic attack) 03/28/2016    Past Surgical History:  Procedure Laterality Date  . bone spur toe left foot    . carpal tunnel both hands  2005-2006  . fusion lower back  11/2008  . fusion lower back  10/1998  . fusion to lower back  11/2010  . knee arthoscopy  1995/2000   five on right leg  . ulkna release right  2006    OB History    No data available       Home Medications    Prior to Admission medications   Medication Sig Start Date End Date Taking? Authorizing Provider  acetaminophen (TYLENOL) 650 MG CR tablet Take 650 mg by mouth 2 (two) times daily.    Yes [provider]  Ascorbic Acid (VITAMIN C) 1000 MG tablet Take 1,000 mg by mouth daily.   Yes [provider]  aspirin EC 81 MG tablet Take 81 mg by mouth daily.   Yes [provider]  Cholecalciferol (VITAMIN D3) 5000 units CAPS Take 5,000 Units by mouth daily.   Yes [provider]  famotidine (PEPCID) 10 MG tablet Take 10 mg by mouth 2 (two) times daily.   Yes [provider]  gabapentin (NEURONTIN) 300 MG capsule Take 1 capsule (300 mg total)  by mouth 3 (three) times daily. 09/04/16  Yes Melvenia Beam, MD  loratadine (CLARITIN) 10 MG tablet Take 10 mg by mouth daily.   Yes [provider]  rosuvastatin (CRESTOR) 10 MG tablet Take 10 mg by mouth every morning. 08/28/16  Yes [provider]  traMADol (ULTRAM) 50 MG tablet Take 50 mg by mouth every morning. 09/01/16  Yes [provider]  traMADol (ULTRAM-ER) 200 MG 24 hr tablet Take 200 mg by mouth every evening.    Yes [provider]  UNABLE TO FIND Take 2 tablets by mouth daily. hylands leg cramps 2 in am   Yes [provider]  vitamin B-12 (CYANOCOBALAMIN) 1000 MCG tablet Take 1,000 mcg by mouth  daily.    Yes [provider]  senna-docusate (SENOKOT-S) 8.6-50 MG tablet Take 1 tablet by mouth at bedtime as needed for mild constipation. 09/21/16   Kerney Elbe, DO    Family History Family History  Problem Relation Age of Onset  . Transient ischemic attack Father     Social History Social History  Substance Use Topics  . Smoking status: Never Smoker  . Smokeless tobacco: Never Used  . Alcohol use No     Allergies   Nsaids   Review of Systems Review of Systems  Constitutional: Negative for activity change.  Respiratory: Negative for shortness of breath.   Cardiovascular: Negative for chest pain.  Gastrointestinal: Negative for abdominal pain, diarrhea, nausea and vomiting.  Musculoskeletal: Negative for back pain.  Skin: Negative for rash.  Neurological: Positive for headaches.   Physical Exam Updated Vital Signs BP 109/72 (BP Location: Left Arm)   Pulse 85   Temp 98 F (36.7 C) (Oral)   Resp 16   Ht '5\' 5"'  (1.651 m)   Wt 75.3 kg (166 lb 0.1 oz)   SpO2 98%   BMI 27.62 kg/m   Physical Exam  Constitutional: She is oriented to person, place, and time. No distress.  HENT:  Head: Normocephalic.  TTP over the bilateral temporal area.  Eyes: Conjunctivae are normal.  Neck: Neck supple.  Cardiovascular: Normal rate, regular rhythm, normal heart sounds and intact distal pulses.  Exam reveals no gallop and no friction rub.   No murmur heard. Pulmonary/Chest: Effort normal and breath sounds normal. No respiratory distress. She has no wheezes. She has no rales. She exhibits no tenderness.  Abdominal: Soft. She exhibits no distension and no mass. There is no tenderness. There is no rebound and no guarding. No hernia.  Neurological: She is alert and oriented to person, place, and time.  Cranial nerves II through XII grossly intact. Good strength against resistance against the large muscle groups of the bilateral upper and lower extremities. Sensation is  grossly intact. Normal finger to nose bilaterally. Normal rapid alternating hand moments. Answers all questions appropriately. No dysarthria. Negative Romberg. Patient slightly off balance with pronator drift. Ambulatory, but slight staggering gait.   Skin: Skin is warm. Capillary refill takes less than 2 seconds. No rash noted.  Psychiatric: Her behavior is normal.  Nursing note and vitals reviewed.    ED Treatments / Results  Labs (all labs ordered are listed, but only abnormal results are displayed) Labs Reviewed  RAPID URINE DRUG SCREEN, HOSP PERFORMED - Abnormal; Notable for the following:       Result Value   Benzodiazepines POSITIVE (*)    All other components within normal limits  URINALYSIS, ROUTINE W REFLEX MICROSCOPIC - Abnormal; Notable for the following:  Color, Urine COLORLESS (*)    Specific Gravity, Urine 1.004 (*)    Leukocytes, UA TRACE (*)    All other components within normal limits  LIPID PANEL - Abnormal; Notable for the following:    Triglycerides 292 (*)    VLDL 58 (*)    All other components within normal limits  COMPREHENSIVE METABOLIC PANEL - Abnormal; Notable for the following:    Glucose, Bld 130 (*)    All other components within normal limits  CBC  DIFFERENTIAL  COMPREHENSIVE METABOLIC PANEL  SEDIMENTATION RATE  C-REACTIVE PROTEIN  PROTIME-INR  HIV ANTIBODY (ROUTINE TESTING)  HEMOGLOBIN A1C  CBC WITH DIFFERENTIAL/PLATELET  MAGNESIUM  PHOSPHORUS  I-STAT TROPONIN, ED  CBG MONITORING, ED  I-STAT CHEM 8, ED    EKG  EKG Interpretation  Date/Time:  Thursday September 20 2016 11:20:07 EDT Ventricular Rate:  63 PR Interval:    QRS Duration: 98 QT Interval:  404 QTC Calculation: 414 R Axis:   18 Text Interpretation:  Sinus rhythm Probable left atrial enlargement Low voltage, precordial leads T wave flattening in III, V2, V3 compared to previous Confirmed by Theotis Burrow [phone]) on 09/20/2016 11:23:22 AM       Radiology Ct Angio Head W  Or Wo Contrast  Result Date: 09/21/2016 CLINICAL DATA:  Possible TIA yesterday morning. Blurred vision, abnormal gait, angioma bulb words. EXAM: CT ANGIOGRAPHY HEAD AND NECK TECHNIQUE: Multidetector CT imaging of the head and neck was performed using the standard protocol during bolus administration of intravenous contrast. Multiplanar CT image reconstructions and MIPs were obtained to evaluate the vascular anatomy. Carotid stenosis measurements (when applicable) are obtained utilizing NASCET criteria, using the distal internal carotid diameter as the denominator. CONTRAST:  50 cc Isovue 370 intravenous COMPARISON:  Brain MRI from yesterday FINDINGS: CTA NECK FINDINGS Aortic arch: No acute finding or dilatation. Three vessel branching. Right carotid system: The vessels are smooth and widely patent. No noted atheromatous changes. Left carotid system: Vessels are smooth and widely patent. No noted atheromatous changes. Vertebral arteries: No proximal subclavian stenosis or ulceration. Codominant vertebral arteries. Skeleton: Disc and facet degeneration, with degenerative C2-3 anterolisthesis. No visible cord impingement to explain the history. Other neck: No incidental mass or inflammation noted. Upper chest: Negative Review of the MIP images confirms the above findings CTA HEAD FINDINGS Anterior circulation: Hypoplastic left A1 segment and fetal type left PCA. No branch occlusion, atheromatous changes, beading, or stenosis. Negative for aneurysm. Posterior circulation: Small basilar in the setting of large posterior communicating arteries, especially on the left. No stenosis, beading, or aneurysm. Negative for branch occlusion. Venous sinuses: Patent Anatomic variants: As above Delayed phase: No abnormal intracranial enhancement. Review of the MIP images confirms the above findings IMPRESSION: Negative CTA of the head and neck.  No explanation for symptoms. Electronically Signed   By: Monte Fantasia M.D.   On:  09/21/2016 08:44   Ct Head Wo Contrast  Result Date: 09/20/2016 CLINICAL DATA:  Dizziness EXAM: CT HEAD WITHOUT CONTRAST TECHNIQUE: Contiguous axial images were obtained from the base of the skull through the vertex without intravenous contrast. COMPARISON:  03/08/2016 FINDINGS: Brain: No evidence of acute infarction, hemorrhage, hydrocephalus, extra-axial collection or mass lesion/mass effect. Vascular: No hyperdense vessel or unexpected calcification. Skull: Normal. Negative for fracture or focal lesion. Sinuses/Orbits: No acute finding. Other: None. IMPRESSION: No acute intracranial abnormality noted. Electronically Signed   By: Inez Catalina M.D.   On: 09/20/2016 12:39   Ct Angio Neck W Or Wo Contrast  Result Date: 09/21/2016 CLINICAL DATA:  Possible TIA yesterday morning. Blurred vision, abnormal gait, angioma bulb words. EXAM: CT ANGIOGRAPHY HEAD AND NECK TECHNIQUE: Multidetector CT imaging of the head and neck was performed using the standard protocol during bolus administration of intravenous contrast. Multiplanar CT image reconstructions and MIPs were obtained to evaluate the vascular anatomy. Carotid stenosis measurements (when applicable) are obtained utilizing NASCET criteria, using the distal internal carotid diameter as the denominator. CONTRAST:  50 cc Isovue 370 intravenous COMPARISON:  Brain MRI from yesterday FINDINGS: CTA NECK FINDINGS Aortic arch: No acute finding or dilatation. Three vessel branching. Right carotid system: The vessels are smooth and widely patent. No noted atheromatous changes. Left carotid system: Vessels are smooth and widely patent. No noted atheromatous changes. Vertebral arteries: No proximal subclavian stenosis or ulceration. Codominant vertebral arteries. Skeleton: Disc and facet degeneration, with degenerative C2-3 anterolisthesis. No visible cord impingement to explain the history. Other neck: No incidental mass or inflammation noted. Upper chest: Negative Review  of the MIP images confirms the above findings CTA HEAD FINDINGS Anterior circulation: Hypoplastic left A1 segment and fetal type left PCA. No branch occlusion, atheromatous changes, beading, or stenosis. Negative for aneurysm. Posterior circulation: Small basilar in the setting of large posterior communicating arteries, especially on the left. No stenosis, beading, or aneurysm. Negative for branch occlusion. Venous sinuses: Patent Anatomic variants: As above Delayed phase: No abnormal intracranial enhancement. Review of the MIP images confirms the above findings IMPRESSION: Negative CTA of the head and neck.  No explanation for symptoms. Electronically Signed   By: Monte Fantasia M.D.   On: 09/21/2016 08:44   Mr Brain Wo Contrast  Result Date: 09/20/2016 CLINICAL DATA:  Headache and right leg numbness. EXAM: MRI HEAD WITHOUT CONTRAST TECHNIQUE: Multiplanar, multiecho pulse sequences of the brain and surrounding structures were obtained without intravenous contrast. COMPARISON:  Head CT 09/20/2016 FINDINGS: Brain: The midline structures are normal. There is no focal diffusion restriction to indicate acute infarct. The brain parenchymal signal is normal and there is no mass lesion. No intraparenchymal hematoma or chronic microhemorrhage. Brain volume is normal for age without age-advanced or lobar predominant atrophy. The dura is normal and there is no extra-axial collection. Vascular: Major intracranial arterial and venous sinus flow voids are preserved. Skull and upper cervical spine: The visualized skull base, calvarium, upper cervical spine and extracranial soft tissues are normal. Sinuses/Orbits: No fluid levels or advanced mucosal thickening. No mastoid or middle ear effusion. Normal orbits. IMPRESSION: Normal brain MRI for age. Electronically Signed   By: Ulyses Jarred M.D.   On: 09/20/2016 20:23    Procedures Procedures (including critical care time)  Medications Ordered in ED Medications  0.9 %   sodium chloride infusion (not administered)  LORazepam (ATIVAN) injection 1 mg (1 mg Intravenous Given 09/20/16 1943)  iopamidol (ISOVUE-370) 76 % injection (50 mLs  Contrast Given 09/21/16 0759)     Initial Impression / Assessment and Plan / ED Course  I have reviewed the triage vital signs and the nursing notes.  Pertinent labs & imaging results that were available during my care of the patient were reviewed by me and considered in my medical decision making (see chart for details).     61 year old female with a h/o of TIA in 03/2016 with right leg numbness and HTN, which has improved since arrival on he ED. Patient has completed all OP workup of TIA in 04/2016. Ct head unremarkable Labs unremarkable. Consulted neurology and spoke with Waunita Schooner, neurology PA,  who recommended admission for MRA x2 and MRI. Spoke with Dr. Myna Hidalgo who will admit the patient. The patient appears reasonably stabilized for admission considering the current resources, flow, and capabilities available in the ED at this time, and I doubt any other [facility] requiring further screening and/or treatment in the ED prior to admission.   Final Clinical Impressions(s) / ED Diagnoses   Final diagnoses:  TIA (transient ischemic attack)    New Prescriptions Discharge Medication List as of 09/21/2016 12:58 PM    START taking these medications   Details  senna-docusate (SENOKOT-S) 8.6-50 MG tablet Take 1 tablet by mouth at bedtime as needed for mild constipation., Starting Fri 09/21/2016, Normal         Elohim Brune A, PA-C 09/22/16 0310    Little, Wenda Overland, MD 09/27/16 2124

## 2016-09-20 NOTE — ED Triage Notes (Signed)
Patient presents today with complaints of headache  Blurred vision, and right sided weakness. Patient reports started at 0730. Patient reports She has a TIA in feb and was seen here. Patient reports headache 5/10. Patient alert and oriented x4 moving all extremities. Patient reports right-sided weakness has resolved but still has the headache and blurred vision.

## 2016-09-21 ENCOUNTER — Observation Stay (HOSPITAL_COMMUNITY): Payer: BLUE CROSS/BLUE SHIELD

## 2016-09-21 ENCOUNTER — Encounter (HOSPITAL_COMMUNITY): Payer: Self-pay | Admitting: Radiology

## 2016-09-21 DIAGNOSIS — G43409 Hemiplegic migraine, not intractable, without status migrainosus: Secondary | ICD-10-CM

## 2016-09-21 DIAGNOSIS — G629 Polyneuropathy, unspecified: Secondary | ICD-10-CM | POA: Diagnosis not present

## 2016-09-21 DIAGNOSIS — H538 Other visual disturbances: Secondary | ICD-10-CM | POA: Diagnosis not present

## 2016-09-21 DIAGNOSIS — G450 Vertebro-basilar artery syndrome: Secondary | ICD-10-CM | POA: Diagnosis not present

## 2016-09-21 LAB — COMPREHENSIVE METABOLIC PANEL
ALK PHOS: 62 U/L (ref 38–126)
ALT: 17 U/L (ref 14–54)
ANION GAP: 10 (ref 5–15)
AST: 32 U/L (ref 15–41)
Albumin: 4.2 g/dL (ref 3.5–5.0)
BILIRUBIN TOTAL: 0.6 mg/dL (ref 0.3–1.2)
BUN: 10 mg/dL (ref 6–20)
CALCIUM: 9.3 mg/dL (ref 8.9–10.3)
CO2: 22 mmol/L (ref 22–32)
Chloride: 106 mmol/L (ref 101–111)
Creatinine, Ser: 0.8 mg/dL (ref 0.44–1.00)
GFR calc Af Amer: >60 mL/min (ref >60)
GLUCOSE: 130 mg/dL — AB (ref 65–99)
POTASSIUM: 4.3 mmol/L (ref 3.5–5.1)
Sodium: 138 mmol/L (ref 135–145)
TOTAL PROTEIN: 6.9 g/dL (ref 6.5–8.1)

## 2016-09-21 LAB — CBC WITH DIFFERENTIAL/PLATELET
Basophils Absolute: 0 10*3/uL (ref 0.0–0.1)
Basophils Relative: 0 %
Eosinophils Absolute: 0.2 10*3/uL (ref 0.0–0.7)
Eosinophils Relative: 3 %
HEMATOCRIT: 38.3 % (ref 36.0–46.0)
HEMOGLOBIN: 12.8 g/dL (ref 12.0–15.0)
LYMPHS ABS: 1 10*3/uL (ref 0.7–4.0)
LYMPHS PCT: 18 %
MCH: 31.1 pg (ref 26.0–34.0)
MCHC: 33.4 g/dL (ref 30.0–36.0)
MCV: 93.2 fL (ref 78.0–100.0)
MONO ABS: 0.3 10*3/uL (ref 0.1–1.0)
MONOS PCT: 5 %
NEUTROS ABS: 4.2 10*3/uL (ref 1.7–7.7)
NEUTROS PCT: 74 %
Platelets: 231 10*3/uL (ref 150–400)
RBC: 4.11 MIL/uL (ref 3.87–5.11)
RDW: 14.1 % (ref 11.5–15.5)
WBC: 5.6 10*3/uL (ref 4.0–10.5)

## 2016-09-21 LAB — MAGNESIUM: MAGNESIUM: 2.1 mg/dL (ref 1.7–2.4)

## 2016-09-21 LAB — LIPID PANEL
CHOLESTEROL: 198 mg/dL (ref 0–200)
HDL: 48 mg/dL (ref >40)
LDL Cholesterol: 92 mg/dL (ref 0–99)
TRIGLYCERIDES: 292 mg/dL — AB (ref <150)
Total CHOL/HDL Ratio: 4.1 RATIO
VLDL: 58 mg/dL — AB (ref 0–40)

## 2016-09-21 LAB — PHOSPHORUS: Phosphorus: 3.8 mg/dL (ref 2.5–4.6)

## 2016-09-21 LAB — HEMOGLOBIN A1C
HEMOGLOBIN A1C: 5.5 % (ref 4.8–5.6)
MEAN PLASMA GLUCOSE: 111.15 mg/dL

## 2016-09-21 LAB — HIV ANTIBODY (ROUTINE TESTING W REFLEX): HIV SCREEN 4TH GENERATION: NONREACTIVE

## 2016-09-21 MED ORDER — IOPAMIDOL (ISOVUE-370) INJECTION 76%
INTRAVENOUS | Status: AC
  Administered 2016-09-21: 50 mL
  Filled 2016-09-21: qty 50

## 2016-09-21 MED ORDER — ASPIRIN EC 325 MG PO TBEC: 325.0000 mg | DELAYED_RELEASE_TABLET | Freq: Every day | ORAL | Status: DC

## 2016-09-21 MED ORDER — SENNOSIDES-DOCUSATE SODIUM 8.6-50 MG PO TABS: 1.0000 | ORAL_TABLET | Freq: Every evening | ORAL | 0 refills | Status: DC | PRN

## 2016-09-21 NOTE — Evaluation (Signed)
Occupational Therapy Evaluation and Discharge Patient Details Name: Rebecca Rose MRN: [id number] DOB: 01/27/1956 Today's Date: 09/21/2016    History of Present Illness Rebecca Rose is a 61 y.o. female with medical history significant for anxiety, TIA, peripheral neuropathy, and remote history of migraines, presenting to the ED for evaluation of headache, transient visual disturbance "floating objects", and transient numbness in the right leg. In the ED she was asymptomatic. MRI came back negative for acute event.   Clinical Impression   PTA Pt independent in ADL/IADL and mobility. Pt's vision remains a tiny bit blurry on the periphery however, completely functional and compensatory strategies were taught. Pt aware of movement through peripheral vision. Pt independent in all ADL in the room with no LOB noted during room mobility. Pt with no questions or concerns at the end of session. No futher OT needed at this time, thank you for the opportunity to serve this patient.     Follow Up Recommendations  No OT follow up    Equipment Recommendations  None recommended by OT    Recommendations for Other Services       Precautions / Restrictions Precautions Precautions: None Restrictions Weight Bearing Restrictions: No      Mobility Bed Mobility Overal bed mobility: Independent                Transfers Overall transfer level: Modified independent Equipment used: None                  Balance Overall balance assessment: No apparent balance deficits (not formally assessed)                                         ADL either performed or assessed with clinical judgement   ADL Overall ADL's : Independent                                             Vision Baseline Vision/History: No visual deficits Patient Visual Report: Blurring of vision Vision Assessment?: Yes Eye Alignment: Within Functional Limits Ocular Range of Motion:  Within Functional Limits Alignment/Gaze Preference: Within Defined Limits Tracking/Visual Pursuits: Able to track stimulus in all quads without difficulty Saccades: Within functional limits Convergence: Within functional limits Visual Fields: No apparent deficits Depth Perception:  [facility]) Additional Comments: blurring is around peripheral edges tested [facility]     Perception     Praxis      Pertinent Vitals/Pain Pain Assessment: No/denies pain     Hand Dominance Right   Extremity/Trunk Assessment Upper Extremity Assessment Upper Extremity Assessment: Overall WFL for tasks assessed   Lower Extremity Assessment Lower Extremity Assessment: Overall WFL for tasks assessed   Cervical / Trunk Assessment Cervical / Trunk Assessment: Normal   Communication Communication Communication: No difficulties   Cognition Arousal/Alertness: Awake/alert Behavior During Therapy: WFL for tasks assessed/performed Overall Cognitive Status: Within Functional Limits for tasks assessed                                     General Comments       Exercises     Shoulder Instructions      Home Living Family/patient expects to be discharged to:: Private residence Living Arrangements:  Spouse/significant other Available Help at Discharge: Family;Friend(s);Available 24 hours/day Type of Home: House Home Access: Stairs to enter [employer] of Steps: 3   Home Layout: One level     Bathroom Shower/Tub: Occupational psychologist: Standard     Home Equipment: Shower seat - built in   Additional Comments: has small dog      Prior Functioning/Environment Level of Independence: Independent        Comments: drives, works full time, fully independent        OT Problem List:        OT Treatment/Interventions:      OT Goals(Current goals can be found in the care plan section) Acute Rehab OT Goals Patient Stated Goal: to get home and back to work OT Goal  Formulation: With patient Time For Goal Achievement: 10/05/16 Potential to Achieve Goals: Good  OT Frequency:     Barriers to D/C:            Co-evaluation              AM-PAC PT "6 Clicks" Daily Activity     Outcome Measure Help from another person eating meals?: None Help from another person taking care of personal grooming?: None Help from another person toileting, which includes using toliet, bedpan, or urinal?: None Help from another person bathing (including washing, rinsing, drying)?: None Help from another person to put on and taking off regular upper body clothing?: None Help from another person to put on and taking off regular lower body clothing?: None 6 Click Score: 24   End of Session Equipment Utilized During Treatment: Gait belt Nurse Communication: Mobility status;Other (comment) (initiated independent sign off)  Activity Tolerance: Patient tolerated treatment well Patient left: in chair;with call bell/phone within reach;with nursing/sitter in room  OT Visit Diagnosis: Low vision, both eyes (H54.2)                Time: 8257-4935 OT Time Calculation (min): 20 min Charges:  OT General Charges $OT Visit: 1 Procedure OT Evaluation $OT Eval Low Complexity: 1 Procedure G-Codes: OT G-codes **NOT FOR INPATIENT CLASS** Functional Assessment Tool Used: AM-PAC 6 Clicks Daily Activity Functional Limitation: Self care Self Care Current Status (L2174): 0 percent impaired, limited or restricted Self Care Goal Status (J1595): 0 percent impaired, limited or restricted Self Care Discharge Status (Z9672): 0 percent impaired, limited or restricted   Hulda Humphrey OTR/L [facility] 09/21/2016, 9:57 AM

## 2016-09-21 NOTE — Progress Notes (Signed)
RN discussed discharge instructions with patient including f/u with pcp, cardiology, and neurology. Pt states she already has appt made with pcp, states she will make an appt for neurology, cardiology to call patient for appt, pt has number if she is not called. Aware senna was added. Aware she must take asa 81mg  today and continue unless directed by md to stop. Neuro assessment unchanged, tele d/c, iv removed. Will continue to monitor.

## 2016-09-21 NOTE — Evaluation (Signed)
Physical Therapy Evaluation Patient Details Name: Rebecca Rose MRN: [id number] DOB: [birth year] Today's Date: 09/21/2016   History of Present Illness  Rebecca Rose is a 60 y.o. female with medical history significant for anxiety, TIA, peripheral neuropathy, and remote history of migraines, presenting to the ED for evaluation of headache, transient visual disturbance "floating objects", and transient numbness in the right leg. In the ED she was asymptomatic. MRI came back negative for acute event.  Clinical Impression  Pt presented sitting OOB in recliner chair, awake and willing to participate in therapy session. Prior to admission, pt reported full independence with all functional mobility and ADLs. Pt ambulated in hallway with supervision without an AD and successfully completed stair training with no concerns. Pt reports that she feels she is at her functional baseline. No further acute PT needs identified at this time. PT signing off.     Follow Up Recommendations No PT follow up    Equipment Recommendations  None recommended by PT    Recommendations for Other Services       Precautions / Restrictions Precautions Precautions: None Restrictions Weight Bearing Restrictions: No      Mobility  Bed Mobility Overal bed mobility: Independent             General bed mobility comments: pt OOB in recliner chair upon arrival  Transfers Overall transfer level: Modified independent Equipment used: None                Ambulation/Gait Ambulation/Gait assistance: Supervision Ambulation Distance (Feet): 200 Feet Assistive device: None Gait Pattern/deviations: Step-through pattern;WFL(Within Functional Limits) Gait velocity: WFL Gait velocity interpretation: at or above normal speed for age/gender General Gait Details: no gait deficits noted  Stairs Stairs: Yes Stairs assistance: Supervision Stair Management: No rails;Alternating pattern;Forwards Number of Stairs:  5    Wheelchair Mobility    Modified Rankin (Stroke Patients Only) Modified Rankin (Stroke Patients Only) Pre-Morbid Rankin Score: No symptoms Modified Rankin: No symptoms     Balance Overall balance assessment: No apparent balance deficits (not formally assessed)                                           Pertinent Vitals/Pain Pain Assessment: 0-10 Pain Score: 5  Pain Location: headache Pain Descriptors / Indicators: Headache Pain Intervention(s): Monitored during session;Premedicated before session    Home Living Family/patient expects to be discharged to:: Private residence Living Arrangements: Spouse/significant other Available Help at Discharge: Family;Friend(s);Available 24 hours/day Type of Home: House Home Access: Stairs to enter   [employer] of Steps: 3 Home Layout: One level Home Equipment: Shower seat - built in Additional Comments: has small dog (a puggle named Chloe)    Prior Function Level of Independence: Independent         Comments: drives, works full time, fully independent     Hand Dominance   Dominant Hand: Right    Extremity/Trunk Assessment   Upper Extremity Assessment Upper Extremity Assessment: Overall WFL for tasks assessed    Lower Extremity Assessment Lower Extremity Assessment: Overall WFL for tasks assessed    Cervical / Trunk Assessment Cervical / Trunk Assessment: Normal  Communication   Communication: No difficulties  Cognition Arousal/Alertness: Awake/alert Behavior During Therapy: WFL for tasks assessed/performed Overall Cognitive Status: Within Functional Limits for tasks assessed  General Comments      Exercises     Assessment/Plan    PT Assessment Patent does not need any further PT services  PT Problem List         PT Treatment Interventions      PT Goals (Current goals can be found in the Care Plan section)   Acute Rehab PT Goals Patient Stated Goal: to get home and back to work    Frequency     Barriers to discharge        Co-evaluation               AM-PAC PT "6 Clicks" Daily Activity  Outcome Measure Difficulty turning over in bed (including adjusting bedclothes, sheets and blankets)?: None Difficulty moving from lying on back to sitting on the side of the bed? : None Difficulty sitting down on and standing up from a chair with arms (e.g., wheelchair, bedside commode, etc,.)?: None Help needed moving to and from a bed to chair (including a wheelchair)?: None Help needed walking in hospital room?: None Help needed climbing 3-5 steps with a railing? : None 6 Click Score: 24    End of Session   Activity Tolerance: Patient tolerated treatment well Patient left: in chair;with call bell/phone within reach Nurse Communication: Mobility status PT Visit Diagnosis: Other symptoms and signs involving the nervous system (R29.898)    Time: 1005-1015 PT Time Calculation (min) (ACUTE ONLY): 10 min   Charges:   PT Evaluation $PT Eval Moderate Complexity: 1 Mod     PT G Codes:   PT G-Codes **NOT FOR INPATIENT CLASS** Functional Assessment Tool Used: AM-PAC 6 Clicks Basic Mobility;Clinical judgement Functional Limitation: Mobility: Walking and moving around Mobility: Walking and Moving Around Current Status (A7014): 0 percent impaired, limited or restricted Mobility: Walking and Moving Around Goal Status (D0301): 0 percent impaired, limited or restricted Mobility: Walking and Moving Around Discharge Status (T1438): 0 percent impaired, limited or restricted    [facility], PT, DPT [place] 09/21/2016, 10:52 AM

## 2016-09-21 NOTE — Discharge Summary (Signed)
Physician Discharge Summary  Rebecca Rose KWI:[id number] DOB: [birth year] DOA: 09/20/2016  PCP: Starlyn Skeans, PA-C  Admit date: 09/20/2016 Discharge date: 09/21/2016  Admitted From: Home Disposition:  Home  Recommendations for Outpatient Follow-up:  1. Follow up with PCP in 1-2 weeks 2. Follow up with Neurology for Hospital Follow Up 3. Follow up with Cardiology for 30 Day Holter Monitor and Outpatient ECHO 4. Please obtain CMP/CBC, Mag Phos in one week  Home Health: No Equipment/Devices: None    Discharge Condition: Stable  CODE STATUS: FULL CODE Diet recommendation: Heart Healthy Carb Modified Diet  Brief/Interim Summary: Rebecca Rose is a 61 y.o. female with medical history significant for anxiety, TIA, peripheral neuropathy, and remote history of migraines, presenting to the ED for evaluation of headache, transient visual disturbance, and transient numbness in the right leg. Patient reports that she had been in her usual state of health until approximately 7 AM when she noted a headache. This was described as moderate in intensity, constant, and throbbing. She later stumbled while walking, attributed it to her neuropathy, but later noted an unusual numbness involving the entire right leg. She also developed a transient visual disturbance with floating objects in her visual field and difficulty seeing secondary to this. She denies any associated fevers or chills, denies abdominal pain, vomiting, or diarrhea. Denies any recent fall or trauma, and denies any recent alcohol or illicit drug use. She continues to have a mild headache upon arrival in the ED, but reports that the vision change and numbness is resolved. She was worked up and admitted for suspected Basilar Migraine r/o TIA. She was worked up and Workup was unrevealing. Symptoms were attributed to Complex Migraine but could not fully rule out TIA. Patient to have rest of workup done as an outpatient as symptoms resolved and  will need outpatient 30 day Holter and ECHO as an outpatient. Patient will need to follow up with PCP, Cardiology, and Neurology as an outpatient as she was deemed medically stable as symptoms have resolved and PT has no follow up recommendations.   Discharge Diagnoses:  Principal Problem:   TIA (transient ischemic attack)  1. Headache, vision disturbance, right-sided numbness from Complex Migraine but cannot fully r/o TIA - Pt presents with headache, transient visual disturbance, and transient numbness involving the right leg  - Head CT is negative and no focal neurologic deficits are illicited on exam  - Neurology is consulting and much appreciated; recommending MRI and CTA Head and Neck with remainder of stroke eval as outpatient if MRI/ CTA negative  - MRI showed Normal brain MRI for age. - CTA Head and Neck showed Negative CTA of the head and neck.  No explanation for symptoms. - Will need to follow up with Cardiology for 30 day Holter - Lipid Panel done and showed Cholesterol of 198, HDL of 48, LDL of 92, TG of 292, VLDL of 58 - C/w ASA and Statin - Follow up with Cardiology for 30 Day Event Monitor and outpatient ECHO - Follow up Neurology at D/C  2. Hx of TIA  - Continue statin and ASA    3. Neuropathy - C/w Gabapentin - Follow Up Neurology as an outpatient   Discharge Instructions  Discharge Instructions    Call MD for:  difficulty breathing, headache or visual disturbances    Complete by:  As directed    Call MD for:  extreme fatigue    Complete by:  As directed    Call MD for:  hives  Complete by:  As directed    Call MD for:  persistant dizziness or light-headedness    Complete by:  As directed    Call MD for:  persistant nausea and vomiting    Complete by:  As directed    Call MD for:  redness, tenderness, or signs of infection (pain, swelling, redness, odor or green/yellow discharge around incision site)    Complete by:  As directed    Call MD for:  severe  uncontrolled pain    Complete by:  As directed    Call MD for:  temperature >100.4    Complete by:  As directed    Diet - low sodium heart healthy    Complete by:  As directed    Discharge instructions    Complete by:  As directed    Follow up with PCP and Neurology within 1 week. Follow up with Cardiology for 30 day Holter Monitor. Take all medications as prescribed. If symptoms change or worsen please return to the ED for re-evaluation.   Increase activity slowly    Complete by:  As directed      Allergies as of 09/21/2016      Reactions   Nsaids Other (See Comments)   Stomach ulcers      Medication List    TAKE these medications   acetaminophen 650 MG CR tablet Commonly known as:  TYLENOL Take 650 mg by mouth 2 (two) times daily. Notes to patient:  May take when needed   aspirin EC 81 MG tablet Take 81 mg by mouth daily.   famotidine 10 MG tablet Commonly known as:  PEPCID Take 10 mg by mouth 2 (two) times daily.   gabapentin 300 MG capsule Commonly known as:  NEURONTIN Take 1 capsule (300 mg total) by mouth 3 (three) times daily.   loratadine 10 MG tablet Commonly known as:  CLARITIN Take 10 mg by mouth daily.   rosuvastatin 10 MG tablet Commonly known as:  CRESTOR Take 10 mg by mouth every morning.   senna-docusate 8.6-50 MG tablet Commonly known as:  Senokot-S Take 1 tablet by mouth at bedtime as needed for mild constipation. Notes to patient:  May take tonight   traMADol 200 MG 24 hr tablet Commonly known as:  ULTRAM-ER Take 200 mg by mouth every evening.   traMADol 50 MG tablet Commonly known as:  ULTRAM Take 50 mg by mouth every morning.   UNABLE TO FIND Take 2 tablets by mouth daily. hylands leg cramps 2 in am   vitamin B-12 1000 MCG tablet Commonly known as:  CYANOCOBALAMIN Take 1,000 mcg by mouth daily.   vitamin C 1000 MG tablet Take 1,000 mg by mouth daily.   Vitamin D3 5000 units Caps Take 5,000 Units by mouth daily.       Follow-up Information    Starlyn Skeans, [place]. Call.   Specialty:  Internal Medicine Why:  Call to schedule follow Up within 1 week Contact information: [place] [place] [address] [phone]        Melvenia Beam, MD. Call.   Specialty:  Neurology Why:  Call to follow up for Hospital Follow up for Complex Migraine Contact information: [place] [address] [phone]        Sherren Mocha, MD. Call.   Specialty:  Cardiology Why:  Call for 30 day Holter Monitor Contact information: [address] [place] [address] [phone]  Allergies  Allergen Reactions  . Nsaids Other (See Comments)    Stomach ulcers   Consultations:  Neurology  Procedures/Studies: Ct Angio Head W Or Wo Contrast  Result Date: 09/21/2016 CLINICAL DATA:  Possible TIA yesterday morning. Blurred vision, abnormal gait, angioma bulb words. EXAM: CT ANGIOGRAPHY HEAD AND NECK TECHNIQUE: Multidetector CT imaging of the head and neck was performed using the standard protocol during bolus administration of intravenous contrast. Multiplanar CT image reconstructions and MIPs were obtained to evaluate the vascular anatomy. Carotid stenosis measurements (when applicable) are obtained utilizing NASCET criteria, using the distal internal carotid diameter as the denominator. CONTRAST:  50 cc Isovue 370 intravenous COMPARISON:  Brain MRI from yesterday FINDINGS: CTA NECK FINDINGS Aortic arch: No acute finding or dilatation. Three vessel branching. Right carotid system: The vessels are smooth and widely patent. No noted atheromatous changes. Left carotid system: Vessels are smooth and widely patent. No noted atheromatous changes. Vertebral arteries: No proximal subclavian stenosis or ulceration. Codominant vertebral arteries. Skeleton: Disc and facet degeneration, with degenerative C2-3 anterolisthesis. No visible cord impingement to explain the  history. Other neck: No incidental mass or inflammation noted. Upper chest: Negative Review of the MIP images confirms the above findings CTA HEAD FINDINGS Anterior circulation: Hypoplastic left A1 segment and fetal type left PCA. No branch occlusion, atheromatous changes, beading, or stenosis. Negative for aneurysm. Posterior circulation: Small basilar in the setting of large posterior communicating arteries, especially on the left. No stenosis, beading, or aneurysm. Negative for branch occlusion. Venous sinuses: Patent Anatomic variants: As above Delayed phase: No abnormal intracranial enhancement. Review of the MIP images confirms the above findings IMPRESSION: Negative CTA of the head and neck.  No explanation for symptoms. Electronically Signed   By: Monte Fantasia M.D.   On: 09/21/2016 08:44   Ct Head Wo Contrast  Result Date: 09/20/2016 CLINICAL DATA:  Dizziness EXAM: CT HEAD WITHOUT CONTRAST TECHNIQUE: Contiguous axial images were obtained from the base of the skull through the vertex without intravenous contrast. COMPARISON:  03/08/2016 FINDINGS: Brain: No evidence of acute infarction, hemorrhage, hydrocephalus, extra-axial collection or mass lesion/mass effect. Vascular: No hyperdense vessel or unexpected calcification. Skull: Normal. Negative for fracture or focal lesion. Sinuses/Orbits: No acute finding. Other: None. IMPRESSION: No acute intracranial abnormality noted. Electronically Signed   By: Inez Catalina M.D.   On: 09/20/2016 12:39   Ct Angio Neck W Or Wo Contrast  Result Date: 09/21/2016 CLINICAL DATA:  Possible TIA yesterday morning. Blurred vision, abnormal gait, angioma bulb words. EXAM: CT ANGIOGRAPHY HEAD AND NECK TECHNIQUE: Multidetector CT imaging of the head and neck was performed using the standard protocol during bolus administration of intravenous contrast. Multiplanar CT image reconstructions and MIPs were obtained to evaluate the vascular anatomy. Carotid stenosis measurements  (when applicable) are obtained utilizing NASCET criteria, using the distal internal carotid diameter as the denominator. CONTRAST:  50 cc Isovue 370 intravenous COMPARISON:  Brain MRI from yesterday FINDINGS: CTA NECK FINDINGS Aortic arch: No acute finding or dilatation. Three vessel branching. Right carotid system: The vessels are smooth and widely patent. No noted atheromatous changes. Left carotid system: Vessels are smooth and widely patent. No noted atheromatous changes. Vertebral arteries: No proximal subclavian stenosis or ulceration. Codominant vertebral arteries. Skeleton: Disc and facet degeneration, with degenerative C2-3 anterolisthesis. No visible cord impingement to explain the history. Other neck: No incidental mass or inflammation noted. Upper chest: Negative Review of the MIP images confirms the above findings CTA HEAD FINDINGS Anterior circulation: Hypoplastic left  A1 segment and fetal type left PCA. No branch occlusion, atheromatous changes, beading, or stenosis. Negative for aneurysm. Posterior circulation: Small basilar in the setting of large posterior communicating arteries, especially on the left. No stenosis, beading, or aneurysm. Negative for branch occlusion. Venous sinuses: Patent Anatomic variants: As above Delayed phase: No abnormal intracranial enhancement. Review of the MIP images confirms the above findings IMPRESSION: Negative CTA of the head and neck.  No explanation for symptoms. Electronically Signed   By: Monte Fantasia M.D.   On: 09/21/2016 08:44   Mr Brain Wo Contrast  Result Date: 09/20/2016 CLINICAL DATA:  Headache and right leg numbness. EXAM: MRI HEAD WITHOUT CONTRAST TECHNIQUE: Multiplanar, multiecho pulse sequences of the brain and surrounding structures were obtained without intravenous contrast. COMPARISON:  Head CT 09/20/2016 FINDINGS: Brain: The midline structures are normal. There is no focal diffusion restriction to indicate acute infarct. The brain  parenchymal signal is normal and there is no mass lesion. No intraparenchymal hematoma or chronic microhemorrhage. Brain volume is normal for age without age-advanced or lobar predominant atrophy. The dura is normal and there is no extra-axial collection. Vascular: Major intracranial arterial and venous sinus flow voids are preserved. Skull and upper cervical spine: The visualized skull base, calvarium, upper cervical spine and extracranial soft tissues are normal. Sinuses/Orbits: No fluid levels or advanced mucosal thickening. No mastoid or middle ear effusion. Normal orbits. IMPRESSION: Normal brain MRI for age. Electronically Signed   By: Ulyses Jarred M.D.   On: 09/20/2016 20:23     Subjective: Seen and examined and was doing well. Symptoms resolved. No CP or SOB. Ready to go home.   Discharge Exam: Vitals:   09/21/16 0700 09/21/16 0900  BP: 110/80 109/72  Pulse: 88 85  Resp:  16  Temp:  98 F (36.7 C)  SpO2: 100% 98%   Vitals:   09/21/16 0300 09/21/16 0500 09/21/16 0700 09/21/16 0900  BP: 91/75 98/76 110/80 109/72  Pulse: 78 77 88 85  Resp:  18  16  Temp:  (!) 97.5 F (36.4 C)  98 F (36.7 C)  TempSrc:  Oral  Oral  SpO2: 98% 96% 100% 98%  Weight:      Height:       General: Pt is alert, awake, not in acute distress Cardiovascular: RRR, S1/S2 +, no rubs, no gallops Respiratory: CTA bilaterally, no wheezing, no rhonchi Abdominal: Soft, NT, ND, bowel sounds + Extremities: no edema, no cyanosis  The results of significant diagnostics from this hospitalization (including imaging, microbiology, ancillary and laboratory) are listed below for reference.    Microbiology: No results found for this or any previous visit (from the past 240 hour(s)).   Labs: BNP (last 3 results) No results for input(s): BNP in the last 8760 hours. Basic Metabolic Panel:  Recent Labs Lab 09/20/16 1135 09/20/16 1143 09/21/16 0914  NA 140 141 138  K 3.8 3.7 4.3  CL 105 102 106  CO2 27  --   22  GLUCOSE 82 78 130*  BUN 10 10 10   CREATININE 0.79 0.80 0.80  CALCIUM 9.4  --  9.3  MG  --   --  2.1  PHOS  --   --  3.8   Liver Function Tests:  Recent Labs Lab 09/20/16 1135 09/21/16 0914  AST 30 32  ALT 18 17  ALKPHOS 62 62  BILITOT 0.7 0.6  PROT 7.1 6.9  ALBUMIN 4.3 4.2   No results for input(s): LIPASE, AMYLASE in the  last 168 hours. No results for input(s): AMMONIA in the last 168 hours. CBC:  Recent Labs Lab 09/20/16 1135 09/20/16 1143 09/21/16 0914  WBC 4.9  --  5.6  NEUTROABS 3.4  --  4.2  HGB 12.3 13.3 12.8  HCT 36.6 39.0 38.3  MCV 93.1  --  93.2  PLT 282  --  231   Cardiac Enzymes: No results for input(s): CKTOTAL, CKMB, CKMBINDEX, TROPONINI in the last 168 hours. BNP: Invalid input(s): POCBNP CBG:  Recent Labs Lab 09/20/16 1558  GLUCAP 72   D-Dimer No results for input(s): DDIMER in the last 72 hours. Hgb A1c  Recent Labs  09/21/16 0537  HGBA1C 5.5   Lipid Profile  Recent Labs  09/21/16 0537  CHOL 198  HDL 48  LDLCALC 92  TRIG 292*  CHOLHDL 4.1   Thyroid function studies No results for input(s): TSH, T4TOTAL, T3FREE, THYROIDAB in the last 72 hours.  Invalid input(s): FREET3 Anemia work up No results for input(s): VITAMINB12, FOLATE, FERRITIN, TIBC, IRON, RETICCTPCT in the last 72 hours. Urinalysis    Component Value Date/Time   COLORURINE COLORLESS (A) 09/20/2016 1203   APPEARANCEUR CLEAR 09/20/2016 1203   LABSPEC 1.004 (L) 09/20/2016 1203   PHURINE 8.0 09/20/2016 1203   GLUCOSEU NEGATIVE 09/20/2016 1203   HGBUR NEGATIVE 09/20/2016 1203   [place] 09/20/2016 [place] 09/20/2016 1203   PROTEINUR NEGATIVE 09/20/2016 1203   NITRITE NEGATIVE 09/20/2016 1203   LEUKOCYTESUR TRACE (A) 09/20/2016 1203   Sepsis Labs Invalid input(s): PROCALCITONIN,  WBC,  LACTICIDVEN Microbiology No results found for this or any previous visit (from the past 240 hour(s)).  Time coordinating discharge: 35  minutes  SIGNED:  Kerney Elbe, DO [facility] 09/21/2016, 9:28 PM Pager [phone]  If 7PM-7AM, please contact night-coverage [email or web address] Password [email or web address]

## 2016-09-21 NOTE — Discharge Instructions (Signed)

## 2016-09-21 NOTE — Progress Notes (Signed)
Reviewed imaging. CTA head and neck negative for any stenosis/occlusion.  MRI head shows no stroke.   Impression  Likely complicated migraine, cannot r/o TIA Continue taking ASA 81 mg and statin Outpatient cardiac event monitor

## 2016-09-21 NOTE — Care Management Note (Signed)
Case Management Note  Patient Details  Name: Rebecca Rose MRN: [id number] Date of Birth: 26-Aug-1955  Subjective/Objective:                 Patient with order to DC to home today. Chart reviewed. No Home Health or Equipment needs, no unacknowledged Case Management consults or medication needs identified at the time of this note. Plan for DC to home. If needs arise today prior to discharge, please call Carles Collet RN CM at [phone].    Action/Plan:   Expected Discharge Date:  09/21/16               Expected Discharge Plan:  Home/Self Care  In-House Referral:     Discharge planning Services  CM Consult  Post Acute Care Choice:    Choice offered to:     DME Arranged:    DME Agency:     HH Arranged:    HH Agency:     Status of Service:  Completed, signed off  If discussed at [employer] of Stay Meetings, dates discussed:    Additional Comments:  Carles Collet, RN 09/21/2016, 1:12 PM

## 2016-09-21 NOTE — Progress Notes (Signed)
SLP Cancellation Note  Patient Details Name: Rebecca Rose MRN: [id number] DOB: Jun 28, 1955   Cancelled treatment:       Reason Eval/Treat Not Completed: SLP screened, no needs identified, will sign off. Pt was encouraged to notify MD if she notices changes in com/cog status once she returns to her daily routine, as home health or outpatient therapy is available if needs arise.   Celia B. [place], [facility], [place]  Shonna Chock 09/21/2016, 10:25 AM

## 2016-09-24 ENCOUNTER — Other Ambulatory Visit: Payer: Self-pay | Admitting: Cardiology

## 2016-09-24 DIAGNOSIS — I639 Cerebral infarction, unspecified: Secondary | ICD-10-CM

## 2016-09-25 ENCOUNTER — Ambulatory Visit: Payer: BLUE CROSS/BLUE SHIELD | Admitting: Nurse Practitioner

## 2016-09-26 DIAGNOSIS — E039 Hypothyroidism, unspecified: Secondary | ICD-10-CM | POA: Diagnosis not present

## 2016-10-04 ENCOUNTER — Emergency Department (HOSPITAL_COMMUNITY): Payer: BLUE CROSS/BLUE SHIELD

## 2016-10-04 ENCOUNTER — Emergency Department (HOSPITAL_COMMUNITY)
Admission: EM | Admit: 2016-10-04 | Discharge: 2016-10-04 | Disposition: A | Discharge status: Home / Self Care | Payer: BLUE CROSS/BLUE SHIELD | Attending: Emergency Medicine | Admitting: Emergency Medicine

## 2016-10-04 ENCOUNTER — Other Ambulatory Visit: Payer: Self-pay | Admitting: Cardiology

## 2016-10-04 ENCOUNTER — Ambulatory Visit (INDEPENDENT_AMBULATORY_CARE_PROVIDER_SITE_OTHER): Payer: BLUE CROSS/BLUE SHIELD

## 2016-10-04 ENCOUNTER — Encounter (HOSPITAL_COMMUNITY): Payer: Self-pay | Admitting: Emergency Medicine

## 2016-10-04 DIAGNOSIS — M25512 Pain in left shoulder: Secondary | ICD-10-CM | POA: Diagnosis not present

## 2016-10-04 DIAGNOSIS — I4891 Unspecified atrial fibrillation: Secondary | ICD-10-CM

## 2016-10-04 DIAGNOSIS — G458 Other transient cerebral ischemic attacks and related syndromes: Secondary | ICD-10-CM

## 2016-10-04 DIAGNOSIS — Z8673 Personal history of transient ischemic attack (TIA), and cerebral infarction without residual deficits: Secondary | ICD-10-CM | POA: Insufficient documentation

## 2016-10-04 DIAGNOSIS — S6991XA Unspecified injury of right wrist, hand and finger(s), initial encounter: Secondary | ICD-10-CM | POA: Diagnosis not present

## 2016-10-04 DIAGNOSIS — I639 Cerebral infarction, unspecified: Secondary | ICD-10-CM

## 2016-10-04 DIAGNOSIS — Y92410 Unspecified street and highway as the place of occurrence of the external cause: Secondary | ICD-10-CM | POA: Diagnosis not present

## 2016-10-04 DIAGNOSIS — Y998 Other external cause status: Secondary | ICD-10-CM | POA: Insufficient documentation

## 2016-10-04 DIAGNOSIS — S0990XA Unspecified injury of head, initial encounter: Secondary | ICD-10-CM | POA: Diagnosis not present

## 2016-10-04 DIAGNOSIS — Z7982 Long term (current) use of aspirin: Secondary | ICD-10-CM | POA: Insufficient documentation

## 2016-10-04 DIAGNOSIS — S3991XA Unspecified injury of abdomen, initial encounter: Secondary | ICD-10-CM | POA: Diagnosis not present

## 2016-10-04 DIAGNOSIS — M7989 Other specified soft tissue disorders: Secondary | ICD-10-CM | POA: Diagnosis not present

## 2016-10-04 DIAGNOSIS — S199XXA Unspecified injury of neck, initial encounter: Secondary | ICD-10-CM | POA: Diagnosis not present

## 2016-10-04 DIAGNOSIS — R0789 Other chest pain: Secondary | ICD-10-CM | POA: Diagnosis not present

## 2016-10-04 DIAGNOSIS — S3993XA Unspecified injury of pelvis, initial encounter: Secondary | ICD-10-CM | POA: Diagnosis not present

## 2016-10-04 DIAGNOSIS — R079 Chest pain, unspecified: Secondary | ICD-10-CM | POA: Diagnosis not present

## 2016-10-04 DIAGNOSIS — T148XXA Other injury of unspecified body region, initial encounter: Secondary | ICD-10-CM | POA: Diagnosis not present

## 2016-10-04 DIAGNOSIS — Z79899 Other long term (current) drug therapy: Secondary | ICD-10-CM | POA: Diagnosis not present

## 2016-10-04 DIAGNOSIS — M25531 Pain in right wrist: Secondary | ICD-10-CM | POA: Diagnosis not present

## 2016-10-04 DIAGNOSIS — S299XXA Unspecified injury of thorax, initial encounter: Secondary | ICD-10-CM | POA: Diagnosis not present

## 2016-10-04 DIAGNOSIS — Y939 Activity, unspecified: Secondary | ICD-10-CM | POA: Insufficient documentation

## 2016-10-04 LAB — I-STAT TROPONIN, ED: TROPONIN I, POC: 0 ng/mL (ref 0.00–0.08)

## 2016-10-04 LAB — I-STAT CHEM 8, ED
BUN: 11 mg/dL (ref 6–20)
CHLORIDE: 104 mmol/L (ref 101–111)
Calcium, Ion: 1.23 mmol/L (ref 1.15–1.40)
Creatinine, Ser: 0.7 mg/dL (ref 0.44–1.00)
Glucose, Bld: 83 mg/dL (ref 65–99)
HEMATOCRIT: 40 % (ref 36.0–46.0)
Hemoglobin: 13.6 g/dL (ref 12.0–15.0)
POTASSIUM: 4.1 mmol/L (ref 3.5–5.1)
SODIUM: 141 mmol/L (ref 135–145)
TCO2: 29 mmol/L (ref 22–32)

## 2016-10-04 MED ORDER — ACETAMINOPHEN 500 MG PO TABS
1000.0000 mg | ORAL_TABLET | Freq: Once | ORAL | Status: AC
  Administered 2016-10-04: 1000 mg via ORAL
  Filled 2016-10-04: qty 2

## 2016-10-04 MED ORDER — IOPAMIDOL (ISOVUE-300) INJECTION 61%
INTRAVENOUS | Status: AC
  Administered 2016-10-04: 100 mL
  Filled 2016-10-04: qty 100
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
## 2016-10-05 ENCOUNTER — Encounter: Payer: Self-pay | Admitting: Neurology

## 2016-10-05 ENCOUNTER — Encounter: Payer: Self-pay | Admitting: Interventional Cardiology

## 2016-10-15 ENCOUNTER — Encounter: Payer: Self-pay | Admitting: Interventional Cardiology

## 2016-10-17 NOTE — Progress Notes (Signed)
Called patient and told her to remove the monitor for a few days to let her skin heal. Than to try using sensitive skin electrodes

## 2016-10-23 ENCOUNTER — Telehealth: Payer: Self-pay

## 2016-10-23 NOTE — Telephone Encounter (Signed)
Barkley Boards, RN  Theodoro Parma, RN        Hey!!   This was a pt that Dr Burt Knack was suppose to review a while back to see if he would take over her care. He has said yes. If you can set her up for 20 minute OV after she completes her event monitor so that he can review results with pt.   Thank you,  Lauren      Scheduled patient October 11 for follow-up with Dr. Burt Knack. She was grateful for call and agrees with treatment plan.

## 2016-10-23 NOTE — Telephone Encounter (Deleted)
-----   Message from Barkley Boards, RN sent at 10/18/2016  2:13 PM EDT ----- Regarding: FW: 30 Day Holter Referral  Hey!!  This was a pt that Dr Burt Knack was suppose to review a while back to see if he would take over her care.  He has said yes.  If you can set her up for 20 minute OV after she completes her event monitor so that he can review results with pt.  Thank you, Lauren  ----- Message ----- From: Sherren Mocha, MD Sent: 10/18/2016   9:32 AM To: Barkley Boards, RN Subject: RE: 30 Day Holter Referral                     She's a friend of another friend of mine who wants me to see her. I'm fine to see her ----- Message ----- From: Barkley Boards, RN Sent: 10/17/2016   8:25 PM To: Sherren Mocha, MD Subject: FW: 30 Day Holter Referral                     Did you ever review this pt's chart?  She wanted to switch to you.  Works at [employer] and not sure is sh is a friend of Research scientist (physical sciences). ----- Message ----- From: Barkley Boards, RN Sent: 09/24/2016   3:50 PM To: Barkley Boards, RN, Sonya T Pegram Subject: RE: 30 Day Holter Referral                     Thanks!! Dr Burt Knack is going to review her chart. They put him to follow-up on DC summary but she has seen Dr Irish Lack. I will touch base with her after Dr Burt Knack gets back to me.  Lauren  ----- Message ----- From: Darcey Nora T Sent: 09/24/2016   3:37 PM To: Barkley Boards, RN Subject: FW: 30 Day Holter Referral                     Monitor is scheduled for 08/30. Pt is expecting you to call her with appt with Dr Burt Knack.   Davy Pique   ----- Message ----- From: Candis Schatz Sent: 09/24/2016   2:28 PM To: Davy Pique T Pegram Subject: FW: 30 Day Holter Referral                     Order is in ----- Message ----- From: Kerney Elbe, DO Sent: 09/21/2016  12:46 PM To: Candis Schatz Subject: 30 Day Holter Referral                         Patient sees Dr. Irish Lack but would like to switch to Dr. Burt Knack. Also needs a 30 Day  Holter Monitor per Neurology Reccomendations

## 2016-11-01 DIAGNOSIS — M79641 Pain in right hand: Secondary | ICD-10-CM | POA: Diagnosis not present

## 2016-11-01 DIAGNOSIS — M25531 Pain in right wrist: Secondary | ICD-10-CM | POA: Diagnosis not present

## 2016-11-01 DIAGNOSIS — E039 Hypothyroidism, unspecified: Secondary | ICD-10-CM | POA: Diagnosis not present

## 2016-11-02 ENCOUNTER — Encounter: Payer: Self-pay | Admitting: Cardiovascular Disease

## 2016-11-06 ENCOUNTER — Telehealth: Payer: Self-pay | Admitting: Cardiovascular Disease

## 2016-11-06 NOTE — Telephone Encounter (Signed)
New Message ° ° pt verbalized that she is returning call for rn °

## 2016-11-06 NOTE — Telephone Encounter (Signed)
Rescheduled patient to 10/8 secondary to Dr. Antionette Char schedule change next week. She was grateful for call and agrees with treatment plan.

## 2016-11-12 ENCOUNTER — Ambulatory Visit (INDEPENDENT_AMBULATORY_CARE_PROVIDER_SITE_OTHER): Payer: BLUE CROSS/BLUE SHIELD | Admitting: Cardiovascular Disease

## 2016-11-12 ENCOUNTER — Encounter: Payer: Self-pay | Admitting: Cardiovascular Disease

## 2016-11-12 VITALS — BP 106/70 | HR 92 | Ht 65.0 in | Wt 162.8 lb

## 2016-11-12 DIAGNOSIS — G459 Transient cerebral ischemic attack, unspecified: Secondary | ICD-10-CM

## 2016-11-12 MED ORDER — COQ-10 200 MG PO CAPS: 200.0000 mg | ORAL_CAPSULE | Freq: Every day | ORAL | 0 refills | Status: DC

## 2016-11-12 NOTE — Progress Notes (Signed)
Cardiology Office Note Date:  11/12/2016   ID:  Rebecca Rose, DOB Mar 10, 1955, MRN [id number]  PCP:  Starlyn Skeans, PA-C  Cardiologist:  Sherren Mocha, MD    Chief Complaint  Patient presents with  . Follow-up     History of Present Illness: Rebecca Rose is a 61 y.o. female who presents for follow-up cardiovascular evaluation. She has been seen by Dr Irish Lack in February 2018 after a TIA when she presented with sudden vision loss transiently. Evaluation to date has been negative. She's undergone an echo, event monitor, and carotid duplex. These studies have all been negative. She presents today to follow-up on the findings.  She is here alone today. Today, she denies symptoms of palpitations, orthopnea, PND, lower extremity edema, dizziness, or syncope. She has occasional chest pressure associated with stress. No exertional symptoms. She has some limitation from longstanding peripheral neuropathy. She's been working on weight loss.    Past Medical History:  Diagnosis Date  . Cataracts, bilateral   . Fibroleiomyoma   . Fibromyalgia   . Glaucoma   . History of stomach ulcers   . Neuropathy of leg   . Vision abnormalities     Past Surgical History:  Procedure Laterality Date  . bone spur toe left foot    . carpal tunnel both hands  2005-2006  . fusion lower back  11/2008  . fusion lower back  10/1998  . fusion to lower back  11/2010  . knee arthoscopy  1995/2000   five on right leg  . ulkna release right  2006    Current Outpatient Prescriptions  Medication Sig Dispense Refill  . acetaminophen (TYLENOL) 650 MG CR tablet Take 650 mg by mouth 2 (two) times daily.     . Ascorbic Acid (VITAMIN C) 1000 MG tablet Take 1,000 mg by mouth daily.    Marland Kitchen aspirin EC 81 MG tablet Take 81 mg by mouth daily.    . Cholecalciferol (VITAMIN D3) 5000 units CAPS Take 5,000 Units by mouth daily.    . famotidine (PEPCID) 10 MG tablet Take 10 mg by mouth 2 (two) times daily as needed  for heartburn or indigestion.     . gabapentin (NEURONTIN) 300 MG capsule Take 300 mg by mouth every evening.    Marland Kitchen levothyroxine (SYNTHROID, LEVOTHROID) 75 MCG tablet Take 75 mcg by mouth every morning.  3  . loratadine (CLARITIN) 10 MG tablet Take 10 mg by mouth daily.    Marland Kitchen omeprazole (PRILOSEC) 40 MG capsule Take 40 mg by mouth 2 (two) times daily as needed (ulcers).    . rosuvastatin (CRESTOR) 10 MG tablet Take 10 mg by mouth every morning.    . traMADol (ULTRAM) 50 MG tablet Take 100 mg by mouth 2 (two) times daily as needed (pain).     Marland Kitchen UNABLE TO FIND Take 2 tablets by mouth daily. hylands leg cramps 2 in am    . vitamin B-12 (CYANOCOBALAMIN) 1000 MCG tablet Take 1,000 mcg by mouth daily.      No current facility-administered medications for this visit.     Allergies:   Nsaids   Social History:  The patient  reports that she has never smoked. She has never used smokeless tobacco. She reports that she does not drink alcohol or use drugs.   Family History:  The patient's  family history includes Transient ischemic attack in her father.    ROS:  Please see the history of present illness.  Otherwise, review of systems is  positive for blurry vision, DOE with climbing stairs, leg pain, balance problems.  All other systems are reviewed and negative.    PHYSICAL EXAM: VS:  BP 106/70   Pulse 92   Ht 5\' 5"  (1.651 m)   Wt 162 lb 12.8 oz (73.8 kg)   BMI 27.09 kg/m  , BMI Body mass index is 27.09 kg/m. GEN: Well nourished, well developed, in no acute distress  HEENT: normal  Neck: no JVD, no masses. No carotid bruits Cardiac: RRR without murmur or gallop                Respiratory:  clear to auscultation bilaterally, normal work of breathing GI: soft, nontender, nondistended, + BS MS: no deformity or atrophy  Ext: no pretibial edema, pedal pulses 2+= bilaterally Skin: warm and dry, no rash Neuro:  Strength and sensation are intact Psych: euthymic mood, full affect  EKG:  EKG is  not ordered today.  Recent Labs: 09/04/2016: TSH 10.130 09/21/2016: ALT 17; Magnesium 2.1; Platelets 231 10/04/2016: BUN 11; Creatinine, Ser 0.70; Hemoglobin 13.6; Potassium 4.1; Sodium 141   Lipid Panel     Component Value Date/Time   CHOL 198 09/21/2016 0537   CHOL 266 (H) 03/28/2016 0843   TRIG 292 (H) 09/21/2016 0537   HDL 48 09/21/2016 0537   HDL 70 03/28/2016 0843   CHOLHDL 4.1 09/21/2016 0537   VLDL 58 (H) 09/21/2016 0537   LDLCALC 92 09/21/2016 0537   LDLCALC 165 (H) 03/28/2016 0843      Wt Readings from Last 3 Encounters:  11/12/16 162 lb 12.8 oz (73.8 kg)  10/04/16 166 lb (75.3 kg)  09/20/16 166 lb 0.1 oz (75.3 kg)     Cardiac Studies Reviewed: Event monitor 10-04-2016: Study Highlights   Sinus rhythm to sinus tachycardia No evidence of atrial fibrillation or flutter No significant bradycardic events No ventricular arrhythmia  Benign monitor result   Echo 04-12-2016: Left ventricle:  The cavity size was normal. Wall thickness was normal. Systolic function was normal. The estimated ejection fraction was in the range of 60% to 65%. Doppler parameters are consistent with abnormal left ventricular relaxation (grade 1 diastolic dysfunction).  ------------------------------------------------------------------- Aortic valve:   Structurally normal valve.   Cusp separation was normal.  Doppler:  Transvalvular velocity was within the normal range. There was no stenosis. There was no regurgitation.  ------------------------------------------------------------------- Mitral valve:   Structurally normal valve.   Leaflet separation was normal.  Doppler:  Transvalvular velocity was within the normal range. There was no evidence for stenosis. There was no regurgitation.  ------------------------------------------------------------------- Left atrium:  The atrium was normal in size.  ------------------------------------------------------------------- Right  ventricle:  The cavity size was normal. Wall thickness was normal. Systolic function was normal.  ------------------------------------------------------------------- Tricuspid valve:   Structurally normal valve.   Leaflet separation was normal.  Doppler:  Transvalvular velocity was within the normal range. There was trivial regurgitation.  ------------------------------------------------------------------- Right atrium:  The atrium was normal in size.  ------------------------------------------------------------------- Pericardium:  There was no pericardial effusion.  Carotid Duplex 04-05-2016: No significant extracranial carotid stenosis  MRI Brain 09-20-2016: IMPRESSION: Normal brain MRI for age.  ASSESSMENT AND PLAN: 1.  TIA 2. Hyperlipidemia  Cardiac evaluation reviewed with the patient today. Her echo, event monitor, and carotid duplex exams are all normal. She understands the rationale to continue on ASA 81 mg and a statin drug. She will continue to FU with neurology and her primary physician. We would be happy to see her back in the  future if any cardiac problems arise.   Current medicines are reviewed with the patient today.  The patient does not have concerns regarding medicines.  Labs/ tests ordered today include:  No orders of the defined types were placed in this encounter.   Disposition:   FU prn  Signed, Sherren Mocha, MD  11/12/2016 4:28 PM    [address], [place], [place]  [address] Phone: [phone]; Fax: [phone]

## 2016-11-12 NOTE — Patient Instructions (Signed)
Medication Instructions:  You may try Co-Q 10 200 mg daily.  Labwork: None  Testing/Procedures: None  Follow-Up: Your provider recommends that you schedule a follow-up appointment AS NEEDED.  Any Other Special Instructions Will Be Listed Below (If Applicable).     If you need a refill on your cardiac medications before your next appointment, please call your pharmacy.

## 2016-11-15 ENCOUNTER — Ambulatory Visit: Payer: BLUE CROSS/BLUE SHIELD | Admitting: Cardiovascular Disease

## 2016-11-29 DIAGNOSIS — Z23 Encounter for immunization: Secondary | ICD-10-CM | POA: Diagnosis not present

## 2017-01-19 DIAGNOSIS — M47816 Spondylosis without myelopathy or radiculopathy, lumbar region: Secondary | ICD-10-CM | POA: Diagnosis not present

## 2017-01-19 DIAGNOSIS — M5137 Other intervertebral disc degeneration, lumbosacral region: Secondary | ICD-10-CM | POA: Diagnosis not present

## 2017-01-19 DIAGNOSIS — M7062 Trochanteric bursitis, left hip: Secondary | ICD-10-CM | POA: Diagnosis not present

## 2017-01-19 DIAGNOSIS — M961 Postlaminectomy syndrome, not elsewhere classified: Secondary | ICD-10-CM | POA: Diagnosis not present

## 2017-02-14 DIAGNOSIS — F4322 Adjustment disorder with anxiety: Secondary | ICD-10-CM | POA: Diagnosis not present

## 2017-02-25 DIAGNOSIS — R252 Cramp and spasm: Secondary | ICD-10-CM | POA: Diagnosis not present

## 2017-02-26 DIAGNOSIS — F4322 Adjustment disorder with anxiety: Secondary | ICD-10-CM | POA: Diagnosis not present

## 2017-03-11 DIAGNOSIS — H40003 Preglaucoma, unspecified, bilateral: Secondary | ICD-10-CM | POA: Diagnosis not present

## 2017-03-28 DIAGNOSIS — J180 Bronchopneumonia, unspecified organism: Secondary | ICD-10-CM | POA: Diagnosis not present

## 2017-05-09 DIAGNOSIS — Z23 Encounter for immunization: Secondary | ICD-10-CM | POA: Diagnosis not present

## 2017-06-27 DIAGNOSIS — M5136 Other intervertebral disc degeneration, lumbar region: Secondary | ICD-10-CM | POA: Diagnosis not present

## 2017-06-27 DIAGNOSIS — G894 Chronic pain syndrome: Secondary | ICD-10-CM | POA: Diagnosis not present

## 2017-06-27 DIAGNOSIS — M961 Postlaminectomy syndrome, not elsewhere classified: Secondary | ICD-10-CM | POA: Diagnosis not present

## 2017-07-25 DIAGNOSIS — M5136 Other intervertebral disc degeneration, lumbar region: Secondary | ICD-10-CM | POA: Diagnosis not present

## 2017-07-25 DIAGNOSIS — M961 Postlaminectomy syndrome, not elsewhere classified: Secondary | ICD-10-CM | POA: Diagnosis not present

## 2017-08-01 ENCOUNTER — Other Ambulatory Visit: Payer: Self-pay

## 2017-08-01 MED ORDER — GABAPENTIN 300 MG PO CAPS: 300.0000 mg | ORAL_CAPSULE | Freq: Every evening | ORAL | 3 refills | Status: DC

## 2017-08-02 ENCOUNTER — Telehealth: Payer: Self-pay | Admitting: Neurology

## 2017-08-02 MED ORDER — GABAPENTIN 300 MG PO CAPS: 300.0000 mg | ORAL_CAPSULE | Freq: Three times a day (TID) | ORAL | 3 refills | Status: DC

## 2017-08-02 NOTE — Telephone Encounter (Signed)
Spoke with Rebecca Rose. Scheduled her with Hoyle Sauer NP on 7/31 @ 8:15 arrival time 7:45 and canceled her 8/7 appt with Jaynee Eagles. She was appreciative.

## 2017-08-02 NOTE — Telephone Encounter (Signed)
Pt called stating she would like to continue coming to our office. Stating she can see the NP if needed it would be whatever Dr. Jaynee Eagles saw fit.

## 2017-08-02 NOTE — Telephone Encounter (Addendum)
Spoke with pt. She has been taking the Gabapentin up to 3 times daily. This is also how it was ordered at last office visit 7/18. She is not sure how the refill got sent to [facility] as one 300 mg capsule in the evening. She would like for the prescription to be sent to [facility] for insurance purposes.   Spoke with Dr. Jaynee Eagles. Will refill Gabapentin for a year. Pt can f/u with NP or if she is doing well she can go back to her PCP. Called pt & LVM (Ok per [facility]) and informed her of these options. Also that she can keep her appt in Aug with Jaynee Eagles. Asked for call back and gave pt office number in message.   Called [facility] and canceled the Gabapentin prescriptions on file.

## 2017-08-02 NOTE — Telephone Encounter (Signed)
Pt states that she has requested this medication come through [facility]( [facility] advised pt they are still waiting to receive the Rx) Pt said what was sent to   [id number] [place], [place] [phone] (Phone) [phone] (Fax)   Was only for 1 a day, pt states she takes 3 a day of the gabapentin (NEURONTIN) 300 MG capsule  Pt is asking to be called

## 2017-08-02 NOTE — Addendum Note (Signed)
Addended by: Gildardo Griffes on: 08/02/2017 09:24 AM   Modules accepted: Orders

## 2017-08-15 DIAGNOSIS — G894 Chronic pain syndrome: Secondary | ICD-10-CM | POA: Diagnosis not present

## 2017-09-03 NOTE — Progress Notes (Signed)
[facility]  PATIENT: Rebecca Rose DOB: 08-26-1955   REASON FOR VISIT: Follow-up for idiopathic peripheral neuropathy HISTORY FROM: Patient    HISTORY OF PRESENT ILLNESS:UPDATE 7/31/2019CM Rebecca Rose, 62 year old female returns for follow-up with history of idiopathic peripheral neuropathy.  She continues to complain with burning electric-like pain in both lower extremities.  She has failed Cymbalta and Lyrica in the past.  She is currently on gabapentin 300 mg 1 morning and 2 (2) hours before bedtime.  Unable to increase dose due to daytime drowsiness in the morning.  Her neuropathy pain wakes her up at night and she has trouble sleeping. Past medical history osteoarthritis, fibromyalgia,posterior vitreous detachment both eyes, migraines and TIA x2.  She is currently on aspirin and Crestor for secondary stroke prevention.  She returns for reevaluation. 09/04/16 Rebecca Rose a 62 y.o.femalehere as a referral from Dr. No ref. provider foundfor blurred vision and vision loss of left eye. Past medical history osteoarthritis, fibromyalgia,posterior vitreous detachment both eyes, migraines. Patient reports an episode of painless, complete loss of vision left eye in January which lasted about 3 minutes and self resolved. Has not recurred. Ophthalmologist does not feel this episode is explained by eye findings. Suggested evaluation for TIA or stroke.Happened in January, she was walking to the cafeteria and all of a sudden some pulled a curtain over her left eye, it was completely black, couldn;t see light or shaows. Lasted a minute and went away. She continued to have blurred vision and headaches. The blurred vision is betetr, headaches improved. The left eye stayed blurred for 3 weeks and daily headaches. Still with headaches occasionally. No pain on eye movement. The headache is an ache on the right. She has a history of migraines last one 22 years ago. She is not on a baby  aspirin. No other focal neurologic deficits, associated symptoms, inciting events or modifiable factors. Echo showed grade 1 diastolic dysfunction and she was asked to follow up with primary care, but no thrombus or causes for TIA seen on echocardiogram.   REVIEW OF SYSTEMS: Full 14 system review of systems performed and notable only for those listed, all others are neg:  Constitutional: neg  Cardiovascular: neg Ear/Nose/Throat: neg  Skin: neg Eyes: Blurred vision Respiratory: neg Gastroitestinal: neg  Hematology/Lymphatic: neg  Endocrine: neg Musculoskeletal: Joint pain fibromyalgia Allergy/Immunology: neg Neurological: History of TIA, peripheral neuropathy Psychiatric: neg Sleep : neg   ALLERGIES: Allergies  Allergen Reactions  . Nsaids Other (See Comments)    Stomach ulcers    HOME MEDICATIONS: Outpatient Medications Prior to Visit  Medication Sig Dispense Refill  . acetaminophen (TYLENOL) 650 MG CR tablet Take 650 mg by mouth 2 (two) times daily.     . Ascorbic Acid (VITAMIN C) 1000 MG tablet Take 1,000 mg by mouth daily.    Marland Kitchen aspirin EC 81 MG tablet Take 81 mg by mouth daily.    . Cholecalciferol (VITAMIN D3) 5000 units CAPS Take 5,000 Units by mouth daily.    . Coenzyme Q10 (COQ-10) 200 MG CAPS Take 200 mg by mouth daily. (Patient taking differently: Take 200 mg by mouth 2 (two) times daily. )  0  . famotidine (PEPCID) 10 MG tablet Take 10 mg by mouth 2 (two) times daily as needed for heartburn or indigestion.     . gabapentin (NEURONTIN) 300 MG capsule Take 1 capsule (300 mg total) by mouth 3 (three) times daily. (Patient taking differently: Take 300 mg by mouth 2 (two) times daily. ) 90  capsule 3  . levothyroxine (SYNTHROID, LEVOTHROID) 50 MCG tablet Take 50 mcg by mouth daily before breakfast.    . loratadine (CLARITIN) 10 MG tablet Take 10 mg by mouth daily.    Marland Kitchen omeprazole (PRILOSEC) 40 MG capsule Take 40 mg by mouth 2 (two) times daily as needed (ulcers).    .  rosuvastatin (CRESTOR) 10 MG tablet Take 10 mg by mouth every morning.    . traMADol (ULTRAM) 50 MG tablet Take 50 mg by mouth. Takes 2-3 per day prn    . vitamin B-12 (CYANOCOBALAMIN) 1000 MCG tablet Take 1,000 mcg by mouth daily.     Marland Kitchen levothyroxine (SYNTHROID, LEVOTHROID) 75 MCG tablet Take 75 mcg by mouth every morning.  3  . UNABLE TO FIND Take 2 tablets by mouth daily. hylands leg cramps 2 in am     No facility-administered medications prior to visit.     PAST MEDICAL HISTORY: Past Medical History:  Diagnosis Date  . Cataracts, bilateral   . Fibroleiomyoma   . Fibromyalgia   . Glaucoma   . History of stomach ulcers   . Neuropathy of leg   . Vision abnormalities     PAST SURGICAL HISTORY: Past Surgical History:  Procedure Laterality Date  . bone spur toe left foot    . carpal tunnel both hands  2005-2006  . fusion lower back  11/2008  . fusion lower back  10/1998  . fusion to lower back  11/2010  . knee arthoscopy  1995/2000   five on right leg  . ulkna release right  2006    FAMILY HISTORY: Family History  Problem Relation Age of Onset  . Transient ischemic attack Father     SOCIAL HISTORY: Social History   Socioeconomic History  . Marital status: Married    Spouse name: Not on file  . Number of children: Not on file  . Years of education: Not on file  . Highest education level: Not on file  Occupational History  . Occupation: Art gallery manager  Social Needs  . Financial resource strain: Not on file  . Food insecurity:    Worry: Not on file    Inability: Not on file  . Transportation needs:    Medical: Not on file    Non-medical: Not on file  Tobacco Use  . Smoking status: Never Smoker  . Smokeless tobacco: Never Used  Substance and Sexual Activity  . Alcohol use: No  . Drug use: No  . Sexual activity: Not on file  Lifestyle  . Physical activity:    Days per week: Not on file    Minutes per session: Not on file  . Stress: Not on file    Relationships  . Social connections:    Talks on phone: Not on file    Gets together: Not on file    Attends religious service: Not on file    Active member of club or organization: Not on file    Attends meetings of clubs or organizations: Not on file    Relationship status: Not on file  . Intimate partner violence:    Fear of current or ex partner: Not on file    Emotionally abused: Not on file    Physically abused: Not on file    Forced sexual activity: Not on file  Other Topics Concern  . Not on file  Social History Narrative   Lives at home w/ her husband   Right-caffeine   Caffeine: 1 large cup  of coffee per day     PHYSICAL EXAM  Vitals:   09/04/17 0744  Weight: 154 lb (69.9 kg)  Height: 5\' 5"  (1.651 m)   Body mass index is 25.63 kg/m.  Generalized: Well developed, in no acute distress  Head: normocephalic and atraumatic,. Oropharynx benign  Neck: Supple, no carotid bruits  Cardiac: Regular rate rhythm, no murmur  Musculoskeletal: No deformity   Neurological examination   Mentation: Alert oriented to time, place, history taking. Attention span and concentration appropriate. Recent and remote memory intact.  Follows all commands speech and language fluent.   Cranial nerve II-XII: Pupils were equal round reactive to light extraocular movements were full, visual field were full on confrontational test. Facial sensation and strength were normal. hearing was intact to finger rubbing bilaterally. Uvula tongue midline. head turning and shoulder shrug were normal and symmetric.Tongue protrusion into cheek strength was normal. Motor: normal bulk and tone, full strength in the BUE, BLE, Sensory: normal and symmetric to light touch, pinprick, and  Vibration, in the upper and lower extremities Coordination: finger-nose-finger, heel-to-shin bilaterally, no dysmetria Reflexes: Brachioradialis 2/2, biceps 2/2, triceps 2/2, patellar 2/2, Achilles 2/2, plantar responses were  flexor bilaterally. Gait and Station: Rising up from seated position without assistance, normal stance,  moderate stride, good arm swing, smooth turning, able to perform tiptoe, and heel walking without difficulty. Tandem gait is steady  DIAGNOSTIC DATA (LABS, IMAGING, TESTING) - I reviewed patient records, labs, notes, testing and imaging myself where available.  Lab Results  Component Value Date   WBC 5.6 09/21/2016   HGB 13.6 10/04/2016   HCT 40.0 10/04/2016   MCV 93.2 09/21/2016   PLT 231 09/21/2016      Component Value Date/Time   NA 141 10/04/2016 1207   NA 144 03/28/2016 0843   K 4.1 10/04/2016 1207   CL 104 10/04/2016 1207   CO2 22 09/21/2016 0914   GLUCOSE 83 10/04/2016 1207   BUN 11 10/04/2016 1207   BUN 10 03/28/2016 0843   CREATININE 0.70 10/04/2016 1207   CALCIUM 9.3 09/21/2016 0914   PROT 6.9 09/21/2016 0914   PROT 7.8 09/04/2016 1502   ALBUMIN 4.2 09/21/2016 0914   AST 32 09/21/2016 0914   ALT 17 09/21/2016 0914   ALKPHOS 62 09/21/2016 0914   BILITOT 0.6 09/21/2016 0914   GFRNONAA >60 09/21/2016 0914   GFRAA >60 09/21/2016 0914   Lab Results  Component Value Date   CHOL 198 09/21/2016   HDL 48 09/21/2016   LDLCALC 92 09/21/2016   TRIG 292 (H) 09/21/2016   CHOLHDL 4.1 09/21/2016   Lab Results  Component Value Date   HGBA1C 5.5 09/21/2016   Lab Results  Component Value Date   VITAMINB12 >2000 (H) 09/04/2016   Lab Results  Component Value Date   TSH 10.130 (H) 09/04/2016     ASSESSMENT AND PLAN  62 y.o. femalehere for follow up of  blurred vision  and neuropathy in the feet. Past medical history osteoarthritis, fibromyalgia,posterior vitreous detachment both eyes, migraines. Patient reported an episode of painless, complete loss of vision left eye in January which lasted about 3 minutes and self resolved. Today she is here to discuss ongoing neuropathic pain in her feet for which she has been evaluated by neurology and rheumatology in the past.  Patient reports pain in the feet and neuropathic symptoms however sensory exam is completely normal distally in the feet including deep tendon reflexes, sensation to all modalities.  She has failed Cymbalta  and Lyrica in the past.  She is currently on gabapentin but has morning drowsiness.  She has previously been evaluated by rheumatology and neurology in [place].  She moved here 3 years ago -  Discussed with Dr. Jaynee Eagles  Continue gabapentin for pain relief will refill Add on nortriptyline 10 mg at bedtime Follow-up in 6 to 8 months Dennie Bible, [facility], [facility], [facility] [address] [place], [place] [address] [phone]

## 2017-09-04 ENCOUNTER — Encounter: Payer: Self-pay | Admitting: Nurse Practitioner

## 2017-09-04 ENCOUNTER — Ambulatory Visit: Payer: BLUE CROSS/BLUE SHIELD | Admitting: Nurse Practitioner

## 2017-09-04 DIAGNOSIS — G609 Hereditary and idiopathic neuropathy, unspecified: Secondary | ICD-10-CM | POA: Diagnosis not present

## 2017-09-04 DIAGNOSIS — G629 Polyneuropathy, unspecified: Secondary | ICD-10-CM | POA: Insufficient documentation

## 2017-09-04 MED ORDER — NORTRIPTYLINE HCL 10 MG PO CAPS: 10.0000 mg | ORAL_CAPSULE | Freq: Every day | ORAL | 1 refills | Status: DC

## 2017-09-04 MED ORDER — GABAPENTIN 300 MG PO CAPS: 300.0000 mg | ORAL_CAPSULE | Freq: Three times a day (TID) | ORAL | 1 refills | Status: DC

## 2017-09-04 NOTE — Patient Instructions (Signed)
Continue gabapentin for pain relief Add on nortriptyline 10 mg at bedtime Follow-up in 6 to 8 months

## 2017-09-11 ENCOUNTER — Encounter

## 2017-09-11 ENCOUNTER — Ambulatory Visit: Payer: BLUE CROSS/BLUE SHIELD | Admitting: Neurology

## 2017-09-12 DIAGNOSIS — E785 Hyperlipidemia, unspecified: Secondary | ICD-10-CM | POA: Diagnosis not present

## 2017-09-12 DIAGNOSIS — G629 Polyneuropathy, unspecified: Secondary | ICD-10-CM | POA: Diagnosis not present

## 2017-09-12 DIAGNOSIS — G459 Transient cerebral ischemic attack, unspecified: Secondary | ICD-10-CM | POA: Diagnosis not present

## 2017-09-12 DIAGNOSIS — E039 Hypothyroidism, unspecified: Secondary | ICD-10-CM | POA: Diagnosis not present

## 2017-12-04 DIAGNOSIS — Z79891 Long term (current) use of opiate analgesic: Secondary | ICD-10-CM | POA: Diagnosis not present

## 2017-12-04 DIAGNOSIS — M545 Low back pain: Secondary | ICD-10-CM | POA: Diagnosis not present

## 2017-12-04 DIAGNOSIS — M25552 Pain in left hip: Secondary | ICD-10-CM | POA: Diagnosis not present

## 2018-01-14 DIAGNOSIS — S63632A Sprain of interphalangeal joint of right middle finger, initial encounter: Secondary | ICD-10-CM | POA: Diagnosis not present

## 2018-01-17 DIAGNOSIS — S62622A Displaced fracture of medial phalanx of right middle finger, initial encounter for closed fracture: Secondary | ICD-10-CM | POA: Diagnosis not present

## 2018-02-10 ENCOUNTER — Other Ambulatory Visit: Payer: Self-pay | Admitting: Nurse Practitioner

## 2018-02-10 NOTE — Telephone Encounter (Signed)
Refilled nortriptyline x 3 months with note to pharmacy re: patient needs to schedule FU.

## 2018-03-03 ENCOUNTER — Telehealth: Payer: Self-pay | Admitting: Nurse Practitioner

## 2018-03-03 ENCOUNTER — Encounter: Payer: Self-pay | Admitting: *Deleted

## 2018-03-03 NOTE — Telephone Encounter (Signed)
Patient scheduled with A Lomax, NP. Sent her a my chart message with all details of FU appointment.

## 2018-03-03 NOTE — Telephone Encounter (Signed)
Appointment Request From: Kathrynn Speed    With Provider: Dennie Bible, NP [[facility]]    Preferred Date Range: 04/01/2018 - 04/04/2018    Preferred Times: Tuesday Afternoon, Wednesday Afternoon, Thursday Afternoon, Friday Afternoon    Reason for visit: Request an Appointment    Comments:  follow-up visit as required

## 2018-03-18 ENCOUNTER — Encounter: Payer: Self-pay | Admitting: Neurology

## 2018-03-18 DIAGNOSIS — Z131 Encounter for screening for diabetes mellitus: Secondary | ICD-10-CM | POA: Diagnosis not present

## 2018-03-18 DIAGNOSIS — R42 Dizziness and giddiness: Secondary | ICD-10-CM | POA: Diagnosis not present

## 2018-04-01 ENCOUNTER — Encounter: Payer: Self-pay | Admitting: Family Medicine

## 2018-04-01 ENCOUNTER — Ambulatory Visit: Payer: BLUE CROSS/BLUE SHIELD | Admitting: Family Medicine

## 2018-04-01 VITALS — BP 125/87 | HR 97 | Ht 65.0 in | Wt 164.8 lb

## 2018-04-01 DIAGNOSIS — G609 Hereditary and idiopathic neuropathy, unspecified: Secondary | ICD-10-CM

## 2018-04-01 DIAGNOSIS — Z8673 Personal history of transient ischemic attack (TIA), and cerebral infarction without residual deficits: Secondary | ICD-10-CM

## 2018-04-01 NOTE — Patient Instructions (Signed)
  Continue Gabapentin   Consider Valerian Root for sleep and stress  Consider low carb diet for weight management and decreased inflammation   Alpha-lipoic acid (ALA) - For patients who are refractory to or intolerant of first-line pharmacotherapies and interested in a nutritional supplement approach, we suggest a trial of oral ALA (600 mg daily). ALA is an antioxidant with the potential to diminish oxidative stress, improve the underlying pathophysiology of neuropathy, and reduce pain. (See "Pathogenesis and prevention of diabetic polyneuropathy".) Several prospective, placebo-controlled trials have examined ALA (either intravenous or oral) in patients with painful diabetic neuropathy [[phone]]. In an initial trial, daily intravenous ALA for three weeks was associated with reduced pain, paresthesia, and numbness compared with placebo infusions [49]. In a second trial, oral ALA (600, 1200, or 1800 mg daily) versus placebo was studied in 181 patients with diabetes and symptomatic distal symmetric polyneuropathy (DSPN) [51]. All three doses of oral ALA treatment were associated with a reduction in the neuropathy total symptom score (a summation of stabbing pain, burning pain, paresthesia, and asleep numbness) compared with placebo. The benefit of ALA did not differ by dose. A ?50 percent reduction in neuropathic symptoms was observed in 50 to 62 percent of patients treated with ALA versus 26 percent with placebo. Doses higher than 600 mg daily were limited by increasing adverse events (nausea, vomiting, and vertigo) without increasing efficacy. Confidence in the findings is limited by the short duration of this trial. There are no long-term studies that assess the effect of ALA on progression of neuropathy.

## 2018-04-01 NOTE — Progress Notes (Signed)
PATIENT: Rebecca Rose DOB: 1955-06-01  REASON FOR VISIT: follow up HISTORY FROM: patient  Chief Complaint  Patient presents with  . Follow-up    Rm 5, alone  . Peripheral Neuropathy    migraines, blurred vision, balance, similar sx when had tia, has not come back.       HISTORY OF PRESENT ILLNESS: Today 04/01/18 Rebecca Rose is a 63 y.o. female here today for follow up for idiopathic peripheral neuropathy. She continues gabapentin 600mg  in the evenings. She has tried to increase dose but suffers with daytime sleepiness. She was started on nortriptyline 10mg  at bedtime in 08/2017. She does feel that this helps some. It also helps her sleep but she continues to wake up in the middle of the night. 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 She has her adult son living with her who suffers from depression with suicidal ideations. Her husband is retired.  She had a "really bad headache" about a month ago that lasted for about 5 days. She reports that she had blurred vision and significant pain in her neck with headache. She has a history of migraines but states that she has not had one in years. She reports headache finally resolved after taking Tylenol. She denies vision loss, confusion, trouble speaking or weakness with headache. She does feel that her feeling is off on the bottoms of her feet and occasionally she will miss a step. She denies falls or trouble walking. She has participated in PT in the past but dies not feel it helps. She states that this headache event was not similar to her previous TIA.    She is continuing Crestor and asa for stroke prevention. Recent labs with PCP were perfect with the exception of A1C of 5.7. She has gained about 8 pounds in the past 2-3 months.   HISTORY: (copied from [employer] note on 09/04/2017) UPDATE 7/31/2019CM Rebecca Rose, 63 year old female returns for follow-up with history of idiopathic peripheral neuropathy.   She continues to complain with burning electric-like pain in both lower extremities.  She has failed Cymbalta and Lyrica in the past.  She is currently on gabapentin 300 mg 1 morning and 2 (2) hours before bedtime.  Unable to increase dose due to daytime drowsiness in the morning.  Her neuropathy pain wakes her up at night and she has trouble sleeping. Past medical history osteoarthritis, fibromyalgia,posterior vitreous detachment both eyes, migraines and TIA x2.  She is currently on aspirin and Crestor for secondary stroke prevention.  She returns for reevaluation.  09/04/16 Rebecca Rose a 63 y.o.femalehere as a referral from Dr. No ref. provider foundfor blurred vision and vision loss of left eye. Past medical history osteoarthritis, fibromyalgia,posterior vitreous detachment both eyes, migraines. Patient reports an episode of painless, complete loss of vision left eye in January which lasted about 3 minutes and self resolved. Has not recurred. Ophthalmologist does not feel this episode is explained by eye findings. Suggested evaluation for TIA or stroke.Happened in January, she was walking to the cafeteria and all of a sudden some pulled a curtain over her left eye, it was completely black, couldn;t see light or shaows. Lasted a minute and went away. She continued to have blurred vision and headaches. The blurred vision is betetr, headaches improved. The left eye stayed blurred for 3 weeks and daily headaches. Still with headaches occasionally. No pain on eye movement. The headache is an ache on the right. She has a  history of migraines last one 22 years ago. She is not on a baby aspirin. No other focal neurologic deficits, associated symptoms, inciting events or modifiable factors.Echo showed grade 1 diastolic dysfunction and she was asked to follow up with primary care,but no thrombus or causes forTIAseen on echocardiogram.  REVIEW OF SYSTEMS: Out of a complete 14 system review of symptoms,  the patient complains only of the following symptoms, fatigue, blurred vision, excessive thirst, frequent waking, joint pain, back pain, aching muscles, muscle cramps, walking difficulty, bruise/bleed easily, weakness and all other reviewed systems are negative.  ALLERGIES: Allergies  Allergen Reactions  . Nsaids Other (See Comments)    Stomach ulcers    HOME MEDICATIONS: Outpatient Medications Prior to Visit  Medication Sig Dispense Refill  . acetaminophen (TYLENOL) 650 MG CR tablet Take 650 mg by mouth 2 (two) times daily.     . Ascorbic Acid (VITAMIN C) 1000 MG tablet Take 1,000 mg by mouth daily.    Marland Kitchen aspirin EC 81 MG tablet Take 81 mg by mouth daily.    . Cholecalciferol (VITAMIN D3) 5000 units CAPS Take 5,000 Units by mouth daily.    . Coenzyme Q10 (COQ-10) 200 MG CAPS Take 200 mg by mouth daily. (Patient taking differently: Take 200 mg by mouth 2 (two) times daily. )  0  . famotidine (PEPCID) 10 MG tablet Take 10 mg by mouth 2 (two) times daily as needed for heartburn or indigestion.     . gabapentin (NEURONTIN) 300 MG capsule Take 1 capsule (300 mg total) by mouth 3 (three) times daily. (Patient taking differently: Take 300 mg by mouth 3 (three) times daily. Takes 2 caps daily.) 270 capsule 1  . levothyroxine (SYNTHROID, LEVOTHROID) 50 MCG tablet Take 50 mcg by mouth daily before breakfast.    . loratadine (CLARITIN) 10 MG tablet Take 10 mg by mouth daily.    . nortriptyline (PAMELOR) 10 MG capsule TAKE 1 CAPSULE AT BEDTIME 90 capsule 0  . omeprazole (PRILOSEC) 40 MG capsule Take 40 mg by mouth 2 (two) times daily as needed (ulcers).    . rosuvastatin (CRESTOR) 10 MG tablet Take 10 mg by mouth every morning.    . traMADol (ULTRAM) 50 MG tablet Take 50 mg by mouth. Takes 2-3 per day prn    . vitamin B-12 (CYANOCOBALAMIN) 1000 MCG tablet Take 1,000 mcg by mouth daily.      No facility-administered medications prior to visit.     PAST MEDICAL HISTORY: Past Medical History:    Diagnosis Date  . Cataracts, bilateral   . Fibroleiomyoma   . Fibromyalgia   . Glaucoma   . History of stomach ulcers   . Neuropathy of leg   . Vision abnormalities     PAST SURGICAL HISTORY: Past Surgical History:  Procedure Laterality Date  . bone spur toe left foot    . carpal tunnel both hands  2005-2006  . fusion lower back  11/2008  . fusion lower back  10/1998  . fusion to lower back  11/2010  . knee arthoscopy  1995/2000   five on right leg  . ulkna release right  2006    FAMILY HISTORY: Family History  Problem Relation Age of Onset  . Transient ischemic attack Father     SOCIAL HISTORY: Social History   Socioeconomic History  . Marital status: Married    Spouse name: Not on file  . Number of children: Not on file  . Years of education: Not on file  .  Highest education level: Not on file  Occupational History  . Occupation: Art gallery manager  Social Needs  . Financial resource strain: Not on file  . Food insecurity:    Worry: Not on file    Inability: Not on file  . Transportation needs:    Medical: Not on file    Non-medical: Not on file  Tobacco Use  . Smoking status: Never Smoker  . Smokeless tobacco: Never Used  Substance and Sexual Activity  . Alcohol use: No  . Drug use: No  . Sexual activity: Not on file  Lifestyle  . Physical activity:    Days per week: Not on file    Minutes per session: Not on file  . Stress: Not on file  Relationships  . Social connections:    Talks on phone: Not on file    Gets together: Not on file    Attends religious service: Not on file    Active member of club or organization: Not on file    Attends meetings of clubs or organizations: Not on file    Relationship status: Not on file  . Intimate partner violence:    Fear of current or ex partner: Not on file    Emotionally abused: Not on file    Physically abused: Not on file    Forced sexual activity: Not on file  Other Topics Concern  . Not on file   Social History Narrative   Lives at home w/ her husband   Right-caffeine   Caffeine: 1 large cup of coffee per day      PHYSICAL EXAM  Vitals:   04/01/18 1436  BP: 125/87  Pulse: 97  Weight: 164 lb 12.8 oz (74.8 kg)  Height: 5\' 5"  (1.651 m)   Body mass index is 27.42 kg/m.  Generalized: Well developed, in no acute distress  Cardiology: normal rate and rhythm, no murmur noted Neurological examination  Mentation: Alert oriented to time, place, history taking. Follows all commands speech and language fluent Cranial nerve II-XII: Pupils were equal round reactive to light. Extraocular movements were full, visual field were full on confrontational test. Facial sensation and strength were normal. Uvula tongue midline. Head turning and shoulder shrug  were normal and symmetric. Motor: The motor testing reveals 5 over 5 strength of bilateral upper extremities and 4/5 of bilateral lower extremities (probably due to pain). Good symmetric motor tone is noted throughout.  Sensory: Sensory testing is intact to soft touch on all 4 extremities. No evidence of extinction is noted. Decreased vibratory sensation of right upper and lower extremity.  Coordination: Cerebellar testing reveals good finger-nose-finger bilaterally.  Gait and station: Gait is normal.  Reflexes: Deep tendon reflexes are symmetric and normal bilaterally.   DIAGNOSTIC DATA (LABS, IMAGING, TESTING) - I reviewed patient records, labs, notes, testing and imaging myself where available.  No flowsheet data found.   Lab Results  Component Value Date   WBC 5.6 09/21/2016   HGB 13.6 10/04/2016   HCT 40.0 10/04/2016   MCV 93.2 09/21/2016   PLT 231 09/21/2016      Component Value Date/Time   NA 141 10/04/2016 1207   NA 144 03/28/2016 0843   K 4.1 10/04/2016 1207   CL 104 10/04/2016 1207   CO2 22 09/21/2016 0914   GLUCOSE 83 10/04/2016 1207   BUN 11 10/04/2016 1207   BUN 10 03/28/2016 0843   CREATININE 0.70 10/04/2016  1207   CALCIUM 9.3 09/21/2016 0914   PROT 6.9 09/21/2016  0914   PROT 7.8 09/04/2016 1502   ALBUMIN 4.2 09/21/2016 0914   AST 32 09/21/2016 0914   ALT 17 09/21/2016 0914   ALKPHOS 62 09/21/2016 0914   BILITOT 0.6 09/21/2016 0914   GFRNONAA >60 09/21/2016 0914   GFRAA >60 09/21/2016 0914   Lab Results  Component Value Date   CHOL 198 09/21/2016   HDL 48 09/21/2016   LDLCALC 92 09/21/2016   TRIG 292 (H) 09/21/2016   CHOLHDL 4.1 09/21/2016   Lab Results  Component Value Date   HGBA1C 5.5 09/21/2016   Lab Results  Component Value Date   VITAMINB12 >2000 (H) 09/04/2016   Lab Results  Component Value Date   TSH 10.130 (H) 09/04/2016   03/18/2018 labs brought for review TSH  2.26 A1C 5.7 Creatinine 0.73 CBC normal    ASSESSMENT AND PLAN 63 y.o. year old female  has a past medical history of Cataracts, bilateral, Fibroleiomyoma, Fibromyalgia, Glaucoma, History of stomach ulcers, Neuropathy of leg, and Vision abnormalities. here with     ICD-10-CM   1. Idiopathic peripheral neuropathy G60.9   2. History of transient ischemic attack (TIA) Z86.73     Sora continues to have intermittent difficulty with peripheral neuropathy.  She is tolerating gabapentin 600 mg at night.  She has not been able to increase this dose due to daytime sleepiness.  She has not tried alpha lipoic acid in the past.  I have provided information from up-to-date regarding the supplement and suggested she try 600 mg daily.  We have also discussed other factors contributing to pain and stress.  I feel that she may benefit from valerian root to help with anxiety and sleep.  We have also discussed a healthy lifestyle with regular exercise and low-carb diet.  I feel that the headache described about a month ago were most likely migrainous. She has a history of similar migraines. She will continue nortriptyline as prescribed. She has not had any additional concerns or headaches.  We will follow-up very closely for  any worsening or new symptoms.  She was educated on when to seek emergency medical attention.  We will follow-up in 6 months.   No orders of the defined types were placed in this encounter.    No orders of the defined types were placed in this encounter.     I spent 15 minutes with the patient. 50% of this time was spent counseling and educating patient on plan of care and medications.    Debbora Presto, FNP-C 04/01/2018, 3:36 PM [facility] [address], [place], [place] [address] [phone]

## 2018-04-02 NOTE — Progress Notes (Signed)
Made any corrections needed, and agree with history, physical, neuro exam,assessment and plan as stated.  Ricki Vanhandel, MD 

## 2018-04-21 ENCOUNTER — Other Ambulatory Visit: Payer: Self-pay | Admitting: Neurology

## 2018-04-21 ENCOUNTER — Other Ambulatory Visit: Payer: Self-pay | Admitting: Nurse Practitioner

## 2018-05-22 DIAGNOSIS — M5136 Other intervertebral disc degeneration, lumbar region: Secondary | ICD-10-CM | POA: Diagnosis not present

## 2018-07-17 DIAGNOSIS — M5432 Sciatica, left side: Secondary | ICD-10-CM | POA: Diagnosis not present

## 2018-08-04 DIAGNOSIS — C44311 Basal cell carcinoma of skin of nose: Secondary | ICD-10-CM | POA: Diagnosis not present

## 2018-08-22 DIAGNOSIS — L255 Unspecified contact dermatitis due to plants, except food: Secondary | ICD-10-CM | POA: Diagnosis not present

## 2018-09-08 DIAGNOSIS — S63618A Unspecified sprain of other finger, initial encounter: Secondary | ICD-10-CM | POA: Diagnosis not present

## 2018-09-08 DIAGNOSIS — Z20818 Contact with and (suspected) exposure to other bacterial communicable diseases: Secondary | ICD-10-CM | POA: Diagnosis not present

## 2018-09-08 DIAGNOSIS — R21 Rash and other nonspecific skin eruption: Secondary | ICD-10-CM | POA: Diagnosis not present

## 2018-09-08 DIAGNOSIS — Z20828 Contact with and (suspected) exposure to other viral communicable diseases: Secondary | ICD-10-CM | POA: Diagnosis not present

## 2018-09-11 DIAGNOSIS — C44311 Basal cell carcinoma of skin of nose: Secondary | ICD-10-CM | POA: Diagnosis not present

## 2018-09-25 ENCOUNTER — Other Ambulatory Visit: Payer: Self-pay

## 2018-09-25 MED ORDER — GABAPENTIN 300 MG PO CAPS: 300.0000 mg | ORAL_CAPSULE | Freq: Three times a day (TID) | ORAL | 1 refills | Status: DC

## 2018-09-25 NOTE — Telephone Encounter (Signed)
Received a fax from [facility] where the patient is requesting a refill on gabapentin (NEURONTIN) 300 MG capsule

## 2018-10-01 DIAGNOSIS — E039 Hypothyroidism, unspecified: Secondary | ICD-10-CM | POA: Diagnosis not present

## 2018-10-01 DIAGNOSIS — E785 Hyperlipidemia, unspecified: Secondary | ICD-10-CM | POA: Diagnosis not present

## 2018-10-01 DIAGNOSIS — R631 Polydipsia: Secondary | ICD-10-CM | POA: Diagnosis not present

## 2018-10-01 DIAGNOSIS — F419 Anxiety disorder, unspecified: Secondary | ICD-10-CM | POA: Diagnosis not present

## 2018-10-05 IMAGING — CT CT HEAD W/O CM
4 series · 16 of 47 positions shown, 18 images · non-contrast
Comparison: 03/08/2016

CLINICAL DATA: Dizziness

EXAM:
CT HEAD WITHOUT CONTRAST
TECHNIQUE: Contiguous axial images were obtained from the base of the skull
through the vertex without intravenous contrast.

[Series 3: head without · axial · non-contrast · 0.40mm/px · z∈[-123,-18]mm · 7 of 29 slices shown, 9 images]
[im 4/29  brain]
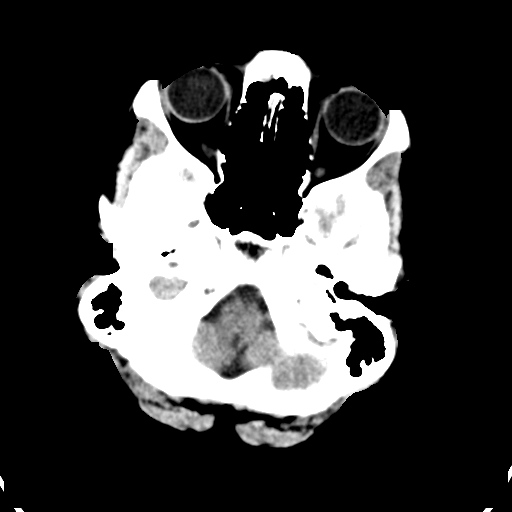
[im 4/29  bone]
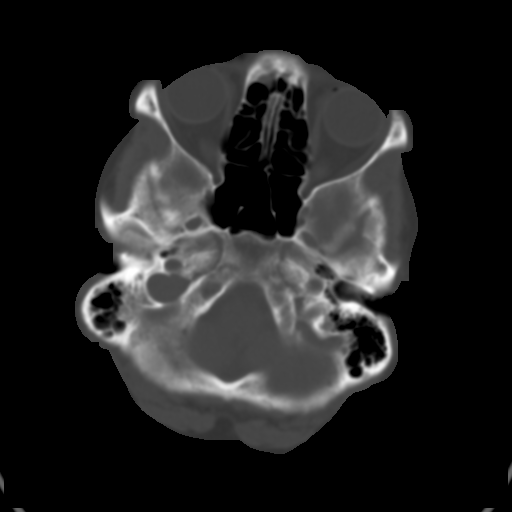
[im 8/29  brain]
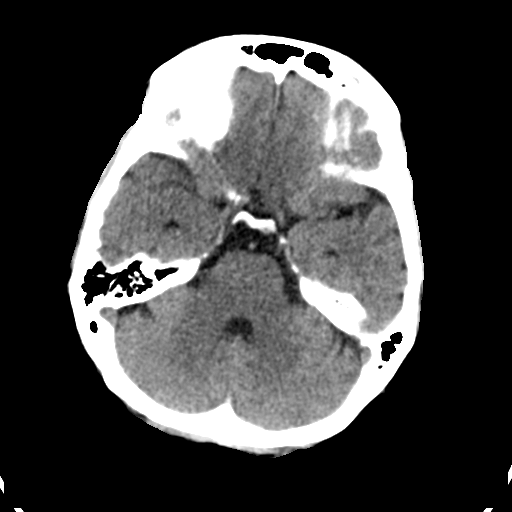
[im 11/29  brain]
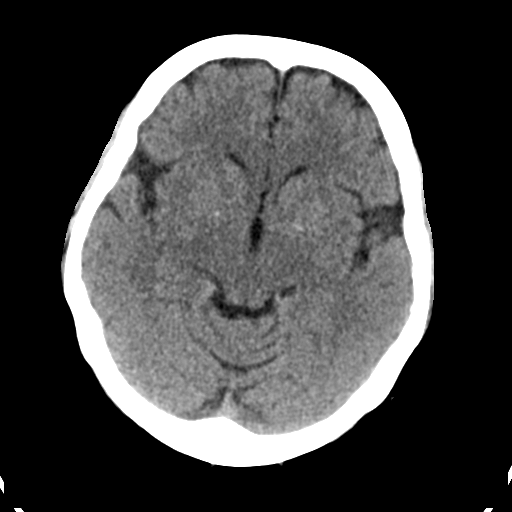
[im 15/29  brain]
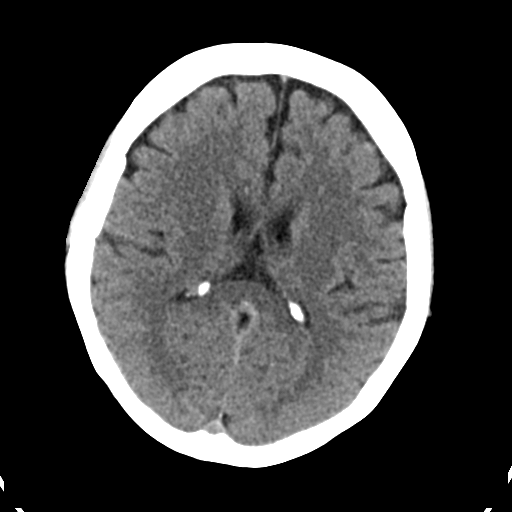
[im 18/29  brain]
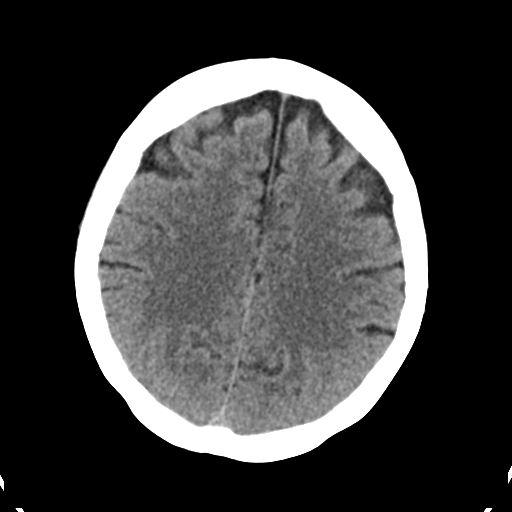
[im 18/29  bone]
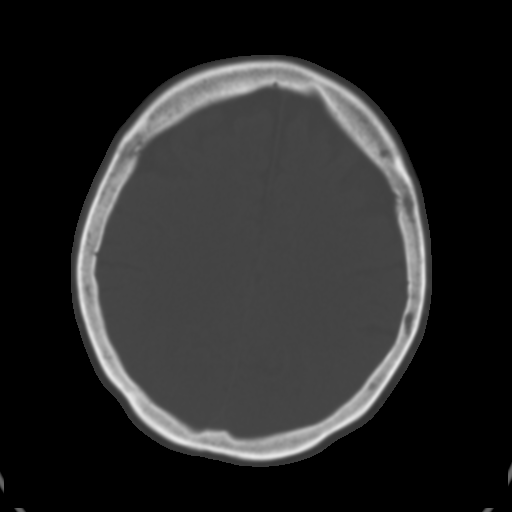
[im 22/29  brain]
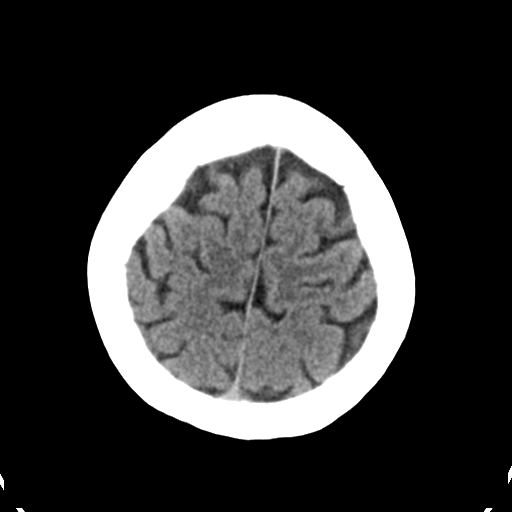
[im 25/29  brain]
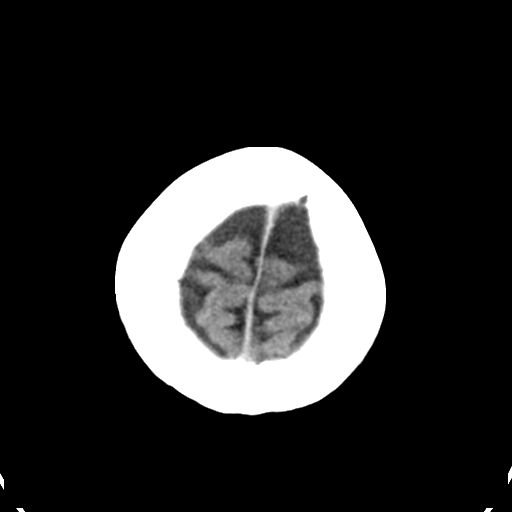

[Series 4: head bone · axial · 0.40mm/px · z∈[-124,-96]mm · 3 of 73 slices shown]
[im 8/73  bone]
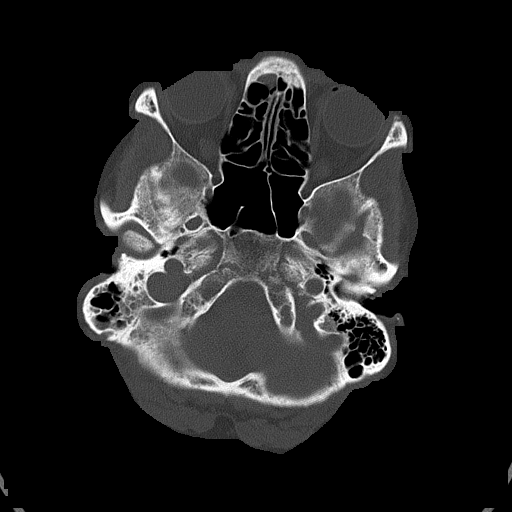
[im 15/73  bone]
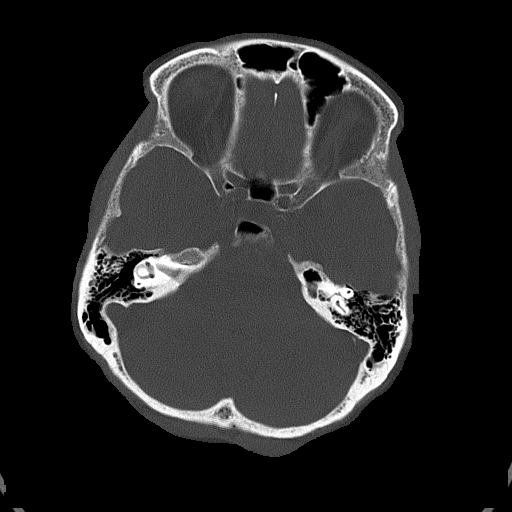
[im 22/73  bone]
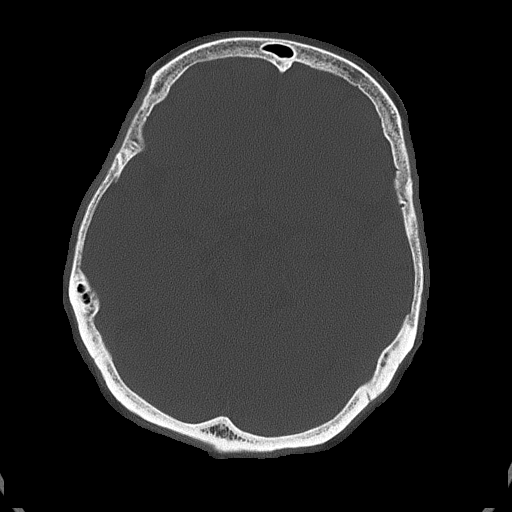

[Series 5: head without cor · coronal · non-contrast · 0.31mm/px · 3 of 58 slices shown]
[im 20/58  brain]
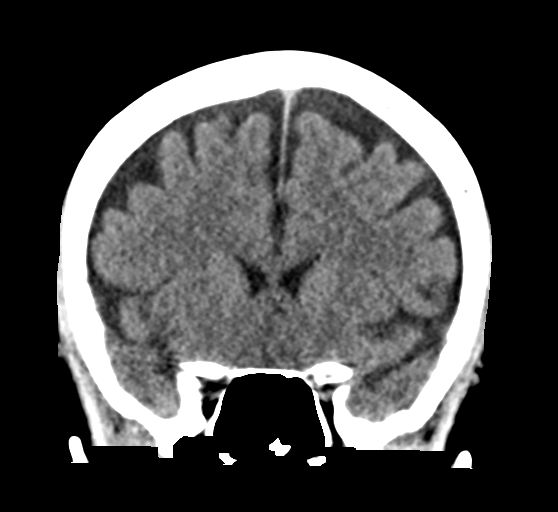
[im 26/58  brain]
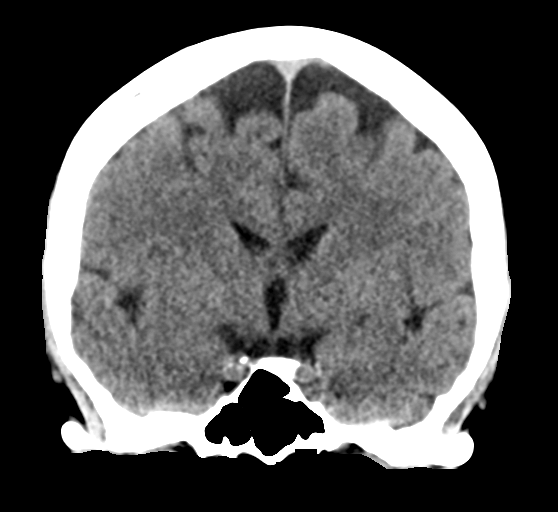
[im 32/58  brain]
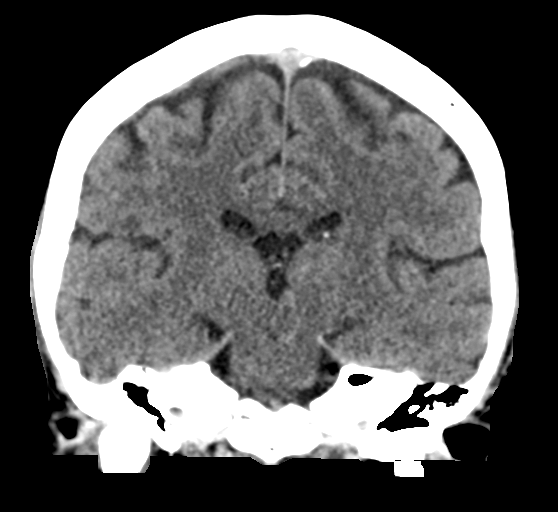

[Series 6: head without sag · sagittal · non-contrast · 0.31mm/px · 3 of 50 slices shown]
[im 17/50  brain]
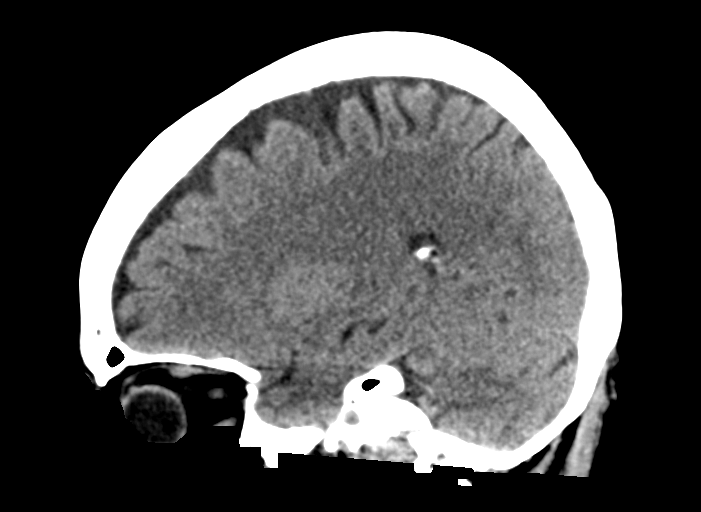
[im 25/50  brain]
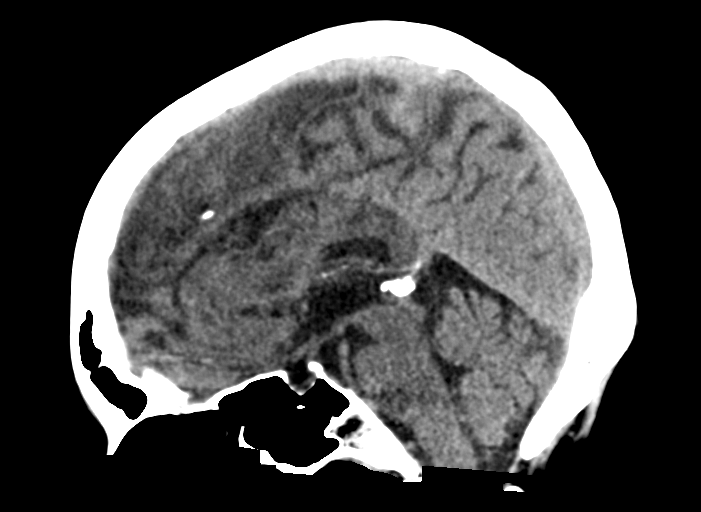
[im 33/50  brain]
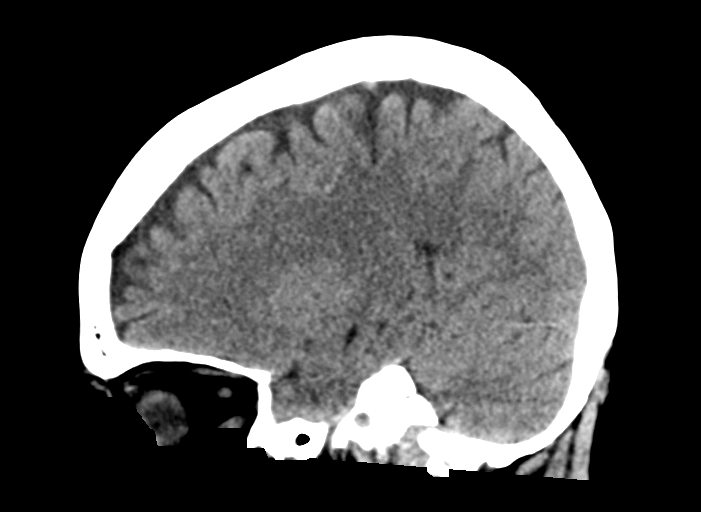

[16 of 47 positions shown; findings below may reference images not displayed]

FINDINGS: Brain: No evidence of acute infarction, hemorrhage, hydrocephalus,
extra-axial collection or mass lesion/mass effect.

Vascular: No hyperdense vessel or unexpected calcification.

Skull: Normal. Negative for fracture or focal lesion.

Sinuses/Orbits: No acute finding.

Other: None.
IMPRESSION: No acute intracranial abnormality noted.

## 2018-10-05 IMAGING — MR MR HEAD W/O CM
9 of 10 series · 37 of 48 positions shown · non-contrast
Comparison: Head CT 09/20/2016

CLINICAL DATA: Headache and right leg numbness.

EXAM:
MRI HEAD WITHOUT CONTRAST
TECHNIQUE: Multiplanar, multiecho pulse sequences of the brain and surrounding
structures were obtained without intravenous contrast.

[Series 3: DWI · axial · 3.0mm · 0.94mm/px · z∈[-17,+128]mm · 8 of 100 slices shown (1 of 2)]
[im 1/100]
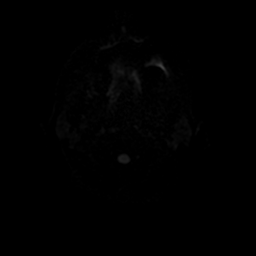
[im 12/100]
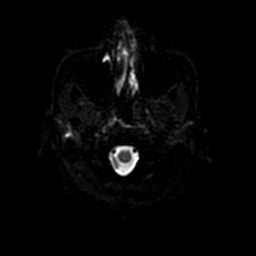
[im 34/100]
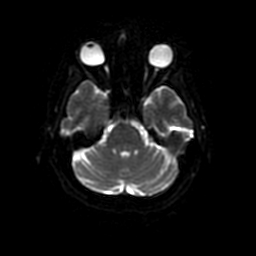
[im 45/100]
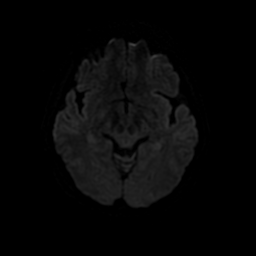
[im 56/100]
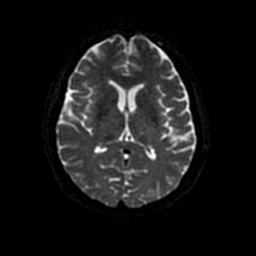
[im 67/100]
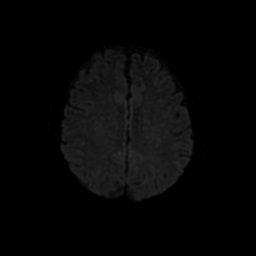
[im 89/100]
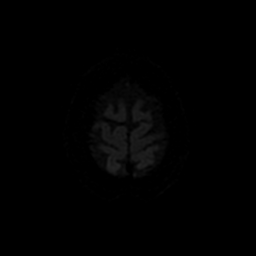
[im 100/100]
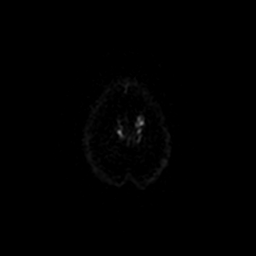

[Series 4: DWI · coronal · 4.0mm · 0.94mm/px · 6 of 62 slices shown (2 of 2)]
[im 1/62]
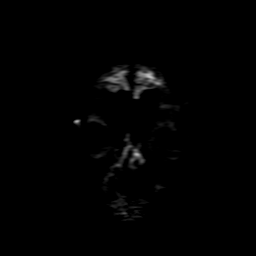
[im 13/62]
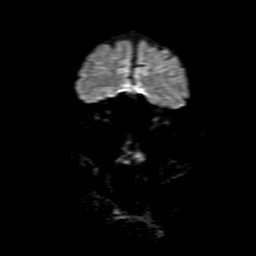
[im 25/62]
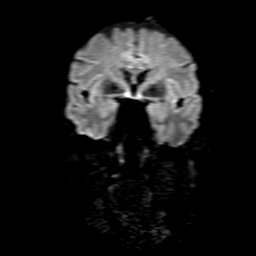
[im 37/62]
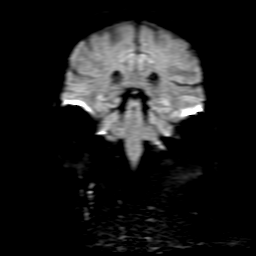
[im 49/62]
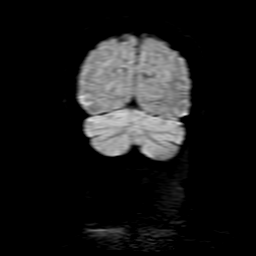
[im 62/62]
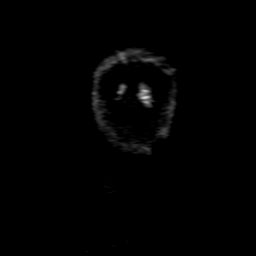

[Series 5: FLAIR · sagittal · 5.0mm · 0.47mm/px · 2 of 23 slices shown (1 of 2)]
[im 1/23]
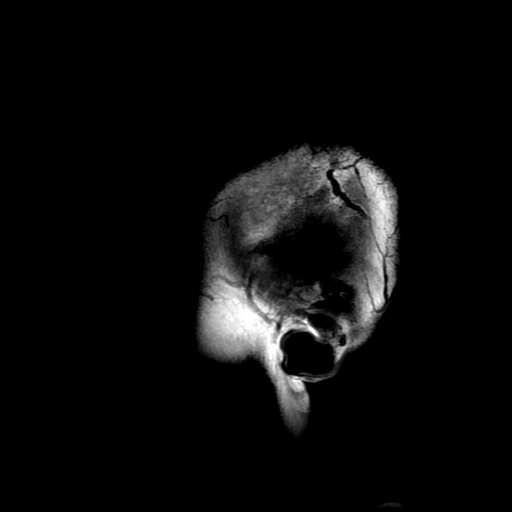
[im 23/23]
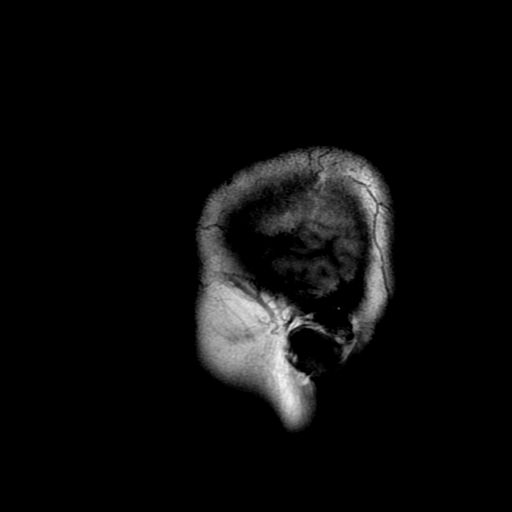

[Series 7: T2 · axial · 5.0mm · 0.47mm/px · z∈[-15,+126]mm · 2 of 25 slices shown (1 of 2)]
[im 1/25]
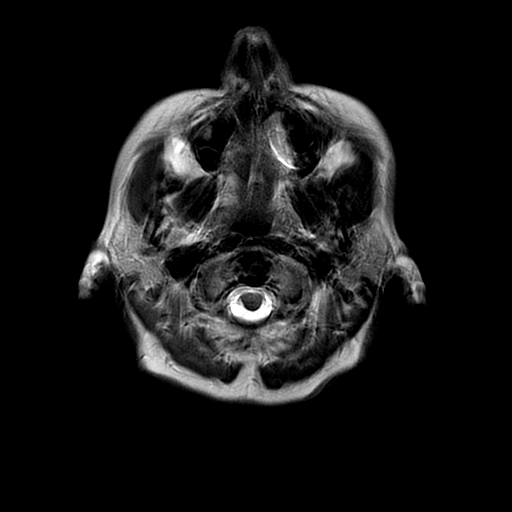
[im 25/25]
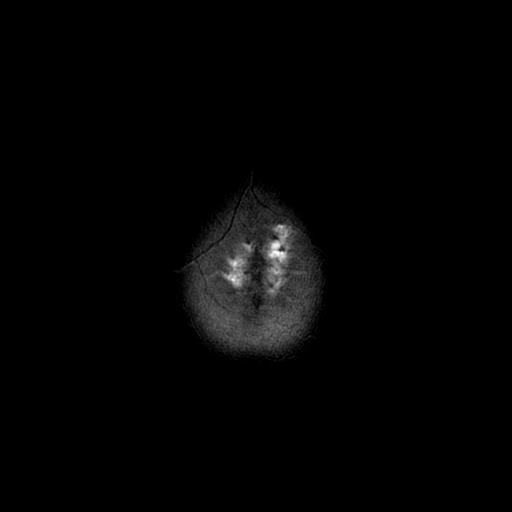

[Series 8: FLAIR · axial · 5.0mm · 0.47mm/px · z∈[-15,+126]mm · 2 of 25 slices shown (2 of 2)]
[im 1/25]
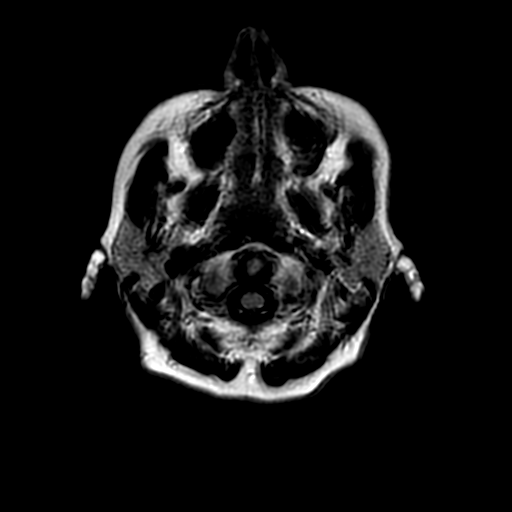
[im 25/25]
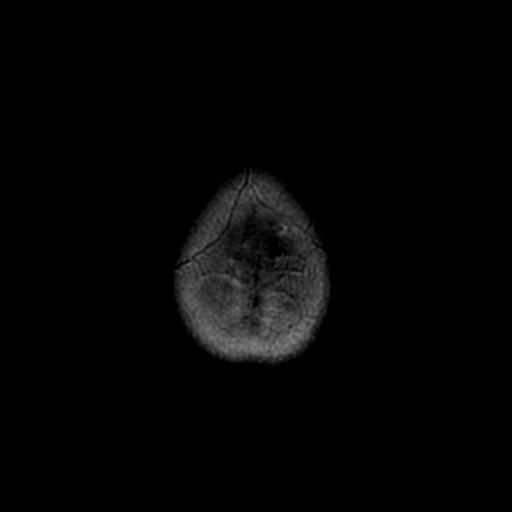

[Series 9: (person_name) · axial · 3.0mm · 0.47mm/px · z∈[-17,+81]mm · 6 of 100 slices shown]
[im 1/100]
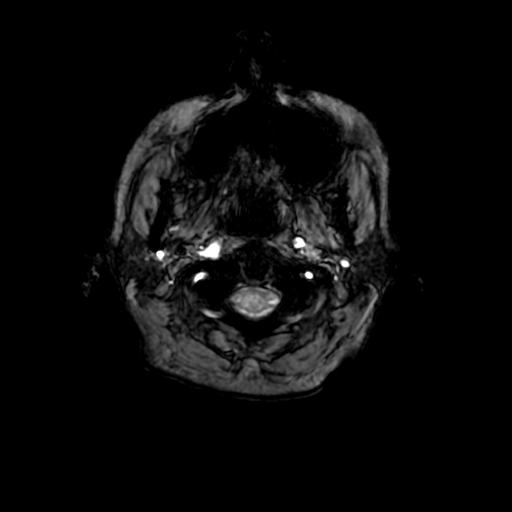
[im 12/100]
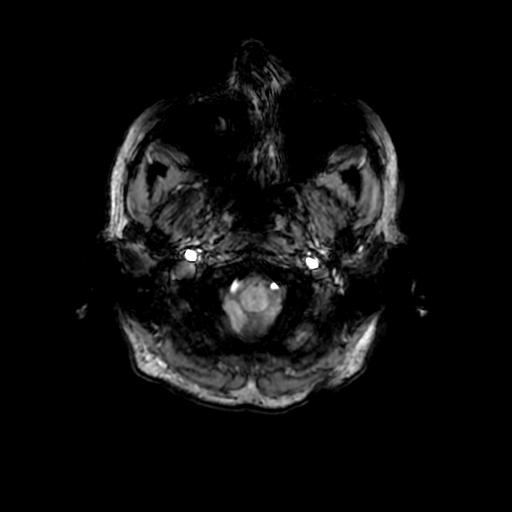
[im 34/100]
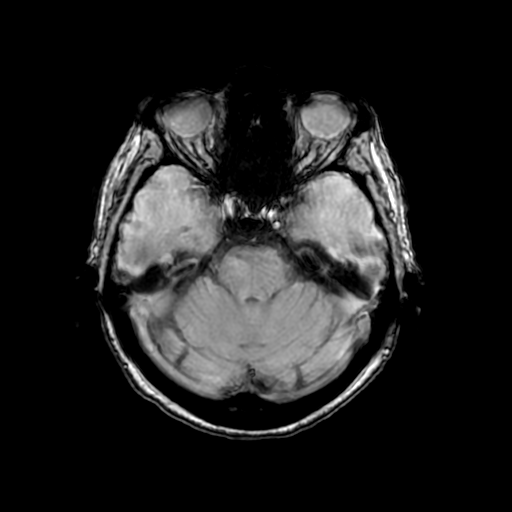
[im 45/100]
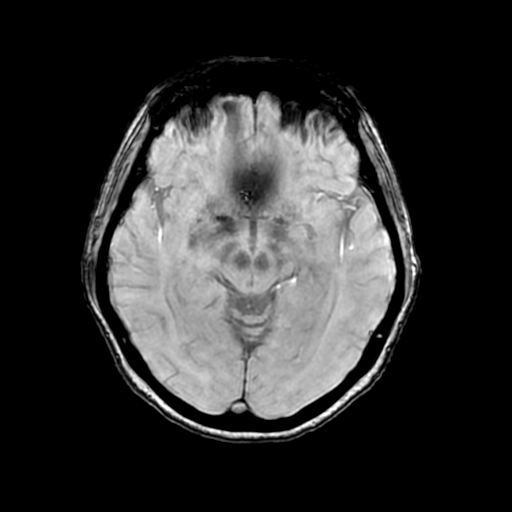
[im 56/100]
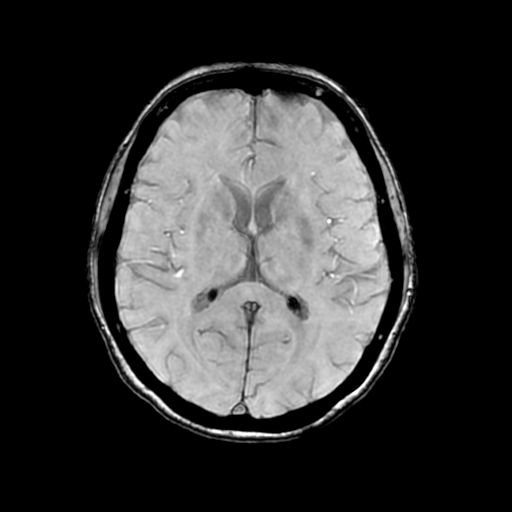
[im 67/100]
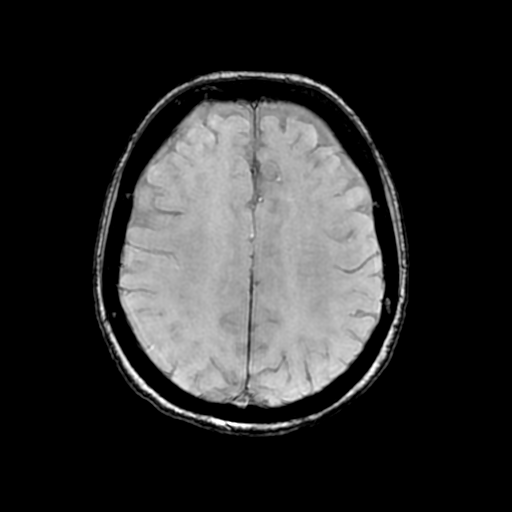

[Series 11: T2 · coronal · 5.0mm · 0.94mm/px · 3 of 26 slices shown (2 of 2)]
[im 1/26]
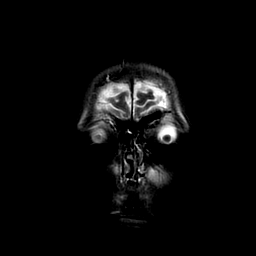
[im 13/26]
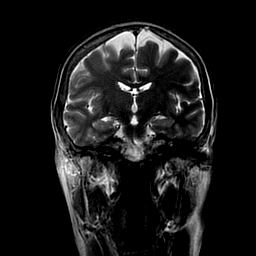
[im 26/26]
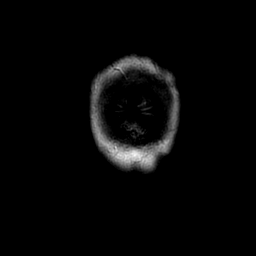

[Series 350: ADC · axial · 3.0mm · 0.94mm/px · z∈[-17,+128]mm · 5 of 50 slices shown (1 of 2)]
[im 1/50]
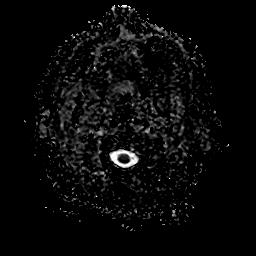
[im 13/50]
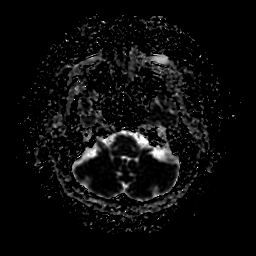
[im 25/50]
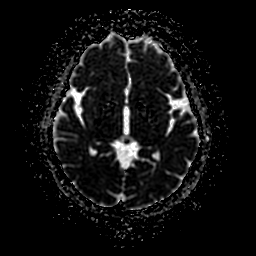
[im 37/50]
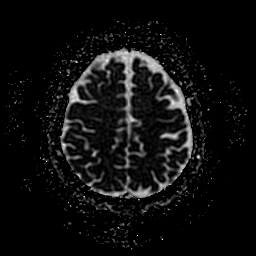
[im 50/50]
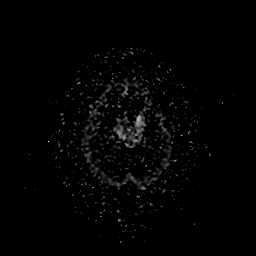

[Series 450: ADC · coronal · 4.0mm · 0.94mm/px · 3 of 31 slices shown (2 of 2)]
[im 1/31]
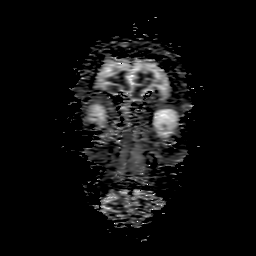
[im 16/31]
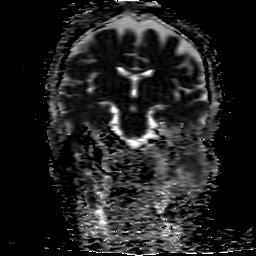
[im 31/31]
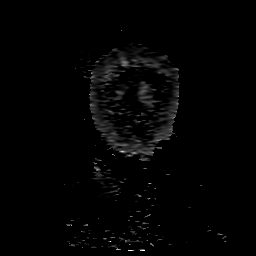

[37 of 48 positions shown; findings below may reference images not displayed]

FINDINGS: Brain: The midline structures are normal. There is no focal
diffusion restriction to indicate acute infarct. The brain
parenchymal signal is normal and there is no mass lesion. No
intraparenchymal hematoma or chronic microhemorrhage. Brain volume
is normal for age without age-advanced or lobar predominant atrophy.
The dura is normal and there is no extra-axial collection.

Vascular: Major intracranial arterial and venous sinus flow voids
are preserved.

Skull and upper cervical spine: The visualized skull base,
calvarium, upper cervical spine and extracranial soft tissues are
normal.

Sinuses/Orbits: No fluid levels or advanced mucosal thickening. No
mastoid or middle ear effusion. Normal orbits.
IMPRESSION: Normal brain MRI for age.

## 2018-11-18 NOTE — Progress Notes (Addendum)
PATIENT: Rebecca Rose DOB: [birth year]  REASON FOR VISIT: follow up HISTORY FROM: patient  Chief Complaint  Patient presents with  . Facial Droop    Room 1, alone. 6 month Neuropathy f/u. "Still has pain."     HISTORY OF PRESENT ILLNESS: Today 11/19/18 Rebecca Rose is a 63 y.o. female here today for follow up for idiopathic peripheral neuropathy. Numbness and tingling in feet continue but she is most bothered by leg pain. Pain is worsening over the past few months. Pain is sharp and feels like electricity in bilateral legs. Pain is all day but worse at night. She had a NCS in [place] and told that she did not have RLS. Has history of low back pain but does not feel this is related. She is failed Cymbalta and Lyrica in the past. She has tried RLS medications (unsure of the name) that did not help. She has fibromyalgia but feel this is under control at this time. She is taking gabapentin 600mg  at 6pm and 300mg  at bedtime (9-10pm). She is also taking nortriptyline 10mg  at bedtime that has helped some. Alpha lipoic acid did not help.  She is managed by Dr Nelva Bush for pain management using injection therapy every 6 months. She takes Tramadol 1-2 tablets daily.   She continues rosuvastatin and asa 81mg  for stroke prevention. BP is normal.   HISTORY: (copied from my note on 04/01/2018)  Rebecca Rose is a 63 y.o. female here today for follow up for idiopathic peripheral neuropathy. She continues gabapentin 600mg  in the evenings. She has tried to increase dose but suffers with daytime sleepiness. She was started on nortriptyline 10mg  at bedtime in 08/2017. She does feel that this helps some. It also helps her sleep but she continues to wake up in the middle of the night. 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 She has her adult son living with her who suffers from depression with suicidal ideations. Her husband is retired.  She had a "really bad headache" about a  month ago that lasted for about 5 days. She reports that she had blurred vision and significant pain in her neck with headache. She has a history of migraines but states that she has not had one in years. She reports headache finally resolved after taking Tylenol. She denies vision loss, confusion, trouble speaking or weakness with headache. She does feel that her feeling is off on the bottoms of her feet and occasionally she will miss a step. She denies falls or trouble walking. She has participated in PT in the past but dies not feel it helps. She states that this headache event was not similar to her previous TIA.    She is continuing Crestor and asa for stroke prevention. Recent labs with PCP were perfect with the exception of A1C of 5.7. She has gained about 8 pounds in the past 2-3 months.   HISTORY: (copied from [employer] note on 09/04/2017) UPDATE7/31/2019CMMs.Hockley,63 year old female returns for follow-up with history of idiopathic peripheral neuropathy. She continues to complain with burning electric-like pain in both lower extremities. She has failed Cymbalta and Lyrica in the past. She is currently on gabapentin 300 mg 1 morning and 2 (2)hours before bedtime.Unable to increase dose due to daytime drowsiness in the morning. Her neuropathy pain wakes her up at night and she has trouble sleeping.Past medical history osteoarthritis, fibromyalgia,posterior vitreous detachment both eyes, migrainesand TIA x2.She is currently on aspirin and Crestor for secondary stroke  prevention. She returns for reevaluation.  09/04/16 Rebecca Rose a 63 y.o.femalehere as a referral from Dr. No ref. provider foundfor blurred vision and vision loss of left eye. Past medical history osteoarthritis, fibromyalgia,posterior vitreous detachment both eyes, migraines. Patient reports an episode of painless, complete loss of vision left eye in January which lasted about 3 minutes and self  resolved. Has not recurred. Ophthalmologist does not feel this episode is explained by eye findings. Suggested evaluation for TIA or stroke.Happened in January, she was walking to the cafeteria and all of a sudden some pulled a curtain over her left eye, it was completely black, couldn;t see light or shaows. Lasted a minute and went away. She continued to have blurred vision and headaches. The blurred vision is betetr, headaches improved. The left eye stayed blurred for 3 weeks and daily headaches. Still with headaches occasionally. No pain on eye movement. The headache is an ache on the right. She has a history of migraines last one 22 years ago. She is not on a baby aspirin. No other focal neurologic deficits, associated symptoms, inciting events or modifiable factors.Echo showed grade 1 diastolic dysfunction and she was asked to follow up with primary care,but no thrombus or causes forTIAseen on echocardiogram.   REVIEW OF SYSTEMS: Out of a complete 14 system review of symptoms, the patient complains only of the following symptoms, numbness, tingling, chronic pain and all other reviewed systems are negative.   ALLERGIES: Allergies  Allergen Reactions  . Nsaids Other (See Comments)    Stomach ulcers  . Seasonal Ic  [Cholestatin]     HOME MEDICATIONS: Outpatient Medications Prior to Visit  Medication Sig Dispense Refill  . acetaminophen (TYLENOL) 650 MG CR tablet Take 650 mg by mouth 2 (two) times daily.     . Ascorbic Acid (VITAMIN C) 1000 MG tablet Take 1,000 mg by mouth daily.    Marland Kitchen aspirin EC 81 MG tablet Take 81 mg by mouth daily.    . Cholecalciferol (VITAMIN D3) 5000 units CAPS Take 5,000 Units by mouth daily.    . Coenzyme Q10 (COQ-10) 200 MG CAPS Take 200 mg by mouth daily. (Patient taking differently: Take 200 mg by mouth 2 (two) times daily. )  0  . famotidine (PEPCID) 10 MG tablet Take 10 mg by mouth 2 (two) times daily as needed for heartburn or indigestion.     . gabapentin  (NEURONTIN) 300 MG capsule Take 1 capsule (300 mg total) by mouth 3 (three) times daily. 270 capsule 1  . levothyroxine (SYNTHROID, LEVOTHROID) 50 MCG tablet Take 50 mcg by mouth daily before breakfast.    . loratadine (CLARITIN) 10 MG tablet Take 10 mg by mouth daily.    Marland Kitchen omeprazole (PRILOSEC) 40 MG capsule Take 40 mg by mouth 2 (two) times daily as needed (ulcers).    . rosuvastatin (CRESTOR) 10 MG tablet Take 10 mg by mouth every morning.    . traMADol (ULTRAM) 50 MG tablet Take 50 mg by mouth. Takes 2-3 per day prn.  (2x max usually)    . vitamin B-12 (CYANOCOBALAMIN) 1000 MCG tablet Take 1,000 mcg by mouth daily.     . nortriptyline (PAMELOR) 10 MG capsule TAKE 1 CAPSULE AT BEDTIME. (NEED TO SCHEDULE FOLLOW UP) 90 capsule 3   No facility-administered medications prior to visit.     PAST MEDICAL HISTORY: Past Medical History:  Diagnosis Date  . Cataracts, bilateral   . Fibroleiomyoma   . Fibromyalgia   . Glaucoma   .  History of stomach ulcers   . Neuropathy of leg   . Vision abnormalities     PAST SURGICAL HISTORY: Past Surgical History:  Procedure Laterality Date  . bone spur toe left foot    . carpal tunnel both hands  2005-2006  . fusion lower back  11/2008  . fusion lower back  10/1998  . fusion to lower back  11/2010  . knee arthoscopy  1995/2000   five on right leg  . ulkna release right  2006    FAMILY HISTORY: Family History  Problem Relation Age of Onset  . Transient ischemic attack Father     SOCIAL HISTORY: Social History   Socioeconomic History  . Marital status: Married    Spouse name: Not on file  . Number of children: Not on file  . Years of education: Not on file  . Highest education level: Not on file  Occupational History  . Occupation: Art gallery manager  Social Needs  . Financial resource strain: Not on file  . Food insecurity    Worry: Not on file    Inability: Not on file  . Transportation needs    Medical: Not on file    Non-medical:  Not on file  Tobacco Use  . Smoking status: Never Smoker  . Smokeless tobacco: Never Used  Substance and Sexual Activity  . Alcohol use: No  . Drug use: No  . Sexual activity: Not on file  Lifestyle  . Physical activity    Days per week: Not on file    Minutes per session: Not on file  . Stress: Not on file  Relationships  . Social Herbalist on phone: Not on file    Gets together: Not on file    Attends religious service: Not on file    Active member of club or organization: Not on file    Attends meetings of clubs or organizations: Not on file    Relationship status: Not on file  . Intimate partner violence    Fear of current or ex partner: Not on file    Emotionally abused: Not on file    Physically abused: Not on file    Forced sexual activity: Not on file  Other Topics Concern  . Not on file  Social History Narrative   Lives at home w/ her husband   Right-caffeine   Caffeine: 1 large cup of coffee per day      PHYSICAL EXAM  Vitals:   11/19/18 0756  BP: 122/84  Pulse: 81  Temp: (!) 97 F (36.1 C)  Weight: 164 lb 12.8 oz (74.8 kg)  Height: 5\' 5"  (1.651 m)   Body mass index is 27.42 kg/m.  Generalized: Well developed, in no acute distress  Cardiology: normal rate and rhythm, no murmur noted Neurological examination  Mentation: Alert oriented to time, place, history taking. Follows all commands speech and language fluent Cranial nerve II-XII: Pupils were equal round reactive to light. Extraocular movements were full, visual field were full on confrontational test.  Motor: The motor testing reveals 5 over 5 strength of all 4 extremities. Good symmetric motor tone is noted throughout.  Sensory: Sensory testing is intact to soft touch on all 4 extremities. No evidence of extinction is noted.  Coordination: Cerebellar testing reveals good finger-nose-finger and heel-to-shin bilaterally.  Gait and station: Gait is normal.   DIAGNOSTIC DATA (LABS,  IMAGING, TESTING) - I reviewed patient records, labs, notes, testing and imaging myself where available.  No flowsheet data found.   Lab Results  Component Value Date   WBC 5.6 09/21/2016   HGB 13.6 10/04/2016   HCT 40.0 10/04/2016   MCV 93.2 09/21/2016   PLT 231 09/21/2016      Component Value Date/Time   NA 141 10/04/2016 1207   NA 144 03/28/2016 0843   K 4.1 10/04/2016 1207   CL 104 10/04/2016 1207   CO2 22 09/21/2016 0914   GLUCOSE 83 10/04/2016 1207   BUN 11 10/04/2016 1207   BUN 10 03/28/2016 0843   CREATININE 0.70 10/04/2016 1207   CALCIUM 9.3 09/21/2016 0914   PROT 6.9 09/21/2016 0914   PROT 7.8 09/04/2016 1502   ALBUMIN 4.2 09/21/2016 0914   AST 32 09/21/2016 0914   ALT 17 09/21/2016 0914   ALKPHOS 62 09/21/2016 0914   BILITOT 0.6 09/21/2016 0914   GFRNONAA >60 09/21/2016 0914   GFRAA >60 09/21/2016 0914   Lab Results  Component Value Date   CHOL 198 09/21/2016   HDL 48 09/21/2016   LDLCALC 92 09/21/2016   TRIG 292 (H) 09/21/2016   CHOLHDL 4.1 09/21/2016   Lab Results  Component Value Date   HGBA1C 5.5 09/21/2016   Lab Results  Component Value Date   VITAMINB12 >2000 (H) 09/04/2016   Lab Results  Component Value Date   TSH 10.130 (H) 09/04/2016     ASSESSMENT AND PLAN 63 y.o. year old female  has a past medical history of Cataracts, bilateral, Fibroleiomyoma, Fibromyalgia, Glaucoma, History of stomach ulcers, Neuropathy of leg, and Vision abnormalities. here with     ICD-10-CM   1. Idiopathic peripheral neuropathy  G60.9   2. History of transient ischemic attack (TIA)  Z86.73     Emojean continues to suffer from numbness and tingling of bilateral lower extremities as well as neuropathic pain in her legs.  She did note some improvement of this pain after starting nortriptyline 10 mg at night.  She feels that this also helps her best.  We will increase dose to 25 mg at bedtime.  She will continue gabapentin as prescribed.  She will continue close  follow-up with pain management as well as primary care provider.  Stroke precautions and prevention discussed.  She will follow-up with me in 6 months, sooner if needed.  She verbalizes understanding and agreement with this plan.   No orders of the defined types were placed in this encounter.    Meds ordered this encounter  Medications  . nortriptyline (PAMELOR) 25 MG capsule    Sig: Take 1 capsule (25 mg total) by mouth at bedtime.    Dispense:  90 capsule    Refill:  3    Order Specific Question:   Supervising Provider    Answer:   Melvenia Beam [id number]      I spent 15 minutes with the patient. 50% of this time was spent counseling and educating patient on plan of care and medications.    Debbora Presto, FNP-C 11/19/2018, 8:39 AM [facility] [address] [place], [place] [address] [phone]  Made any corrections needed, and agree with history, physical, neuro exam,assessment and plan as stated.     Sarina Ill, MD [facility]

## 2018-11-19 ENCOUNTER — Encounter: Payer: Self-pay | Admitting: Family Medicine

## 2018-11-19 ENCOUNTER — Ambulatory Visit: Payer: Medicare Other | Admitting: Family Medicine

## 2018-11-19 ENCOUNTER — Other Ambulatory Visit: Payer: Self-pay

## 2018-11-19 VITALS — BP 122/84 | HR 81 | Temp 97.0°F | Ht 65.0 in | Wt 164.8 lb

## 2018-11-19 DIAGNOSIS — Z8673 Personal history of transient ischemic attack (TIA), and cerebral infarction without residual deficits: Secondary | ICD-10-CM | POA: Diagnosis not present

## 2018-11-19 DIAGNOSIS — G609 Hereditary and idiopathic neuropathy, unspecified: Secondary | ICD-10-CM

## 2018-11-19 MED ORDER — NORTRIPTYLINE HCL 25 MG PO CAPS: 25.0000 mg | ORAL_CAPSULE | Freq: Every day | ORAL | 3 refills | Status: DC

## 2018-11-19 NOTE — Patient Instructions (Signed)
We will increase Pamelor to 25mg  at night  Continue gabapentin as prescribed  Continue stroke prevention as directed  Continue close follow up with PCP and pain management  Neuropathic Pain Neuropathic pain is pain caused by damage to the nerves that are responsible for certain sensations in your body (sensory nerves). The pain can be caused by:  Damage to the sensory nerves that send signals to your spinal cord and brain (peripheral nervous system).  Damage to the sensory nerves in your brain or spinal cord (central nervous system). Neuropathic pain can make you more sensitive to pain. Even a minor sensation can feel very painful. This is usually a long-term condition that can be difficult to treat. The type of pain differs from person to person. It may:  Start suddenly (acute), or it may develop slowly and last for a long time (chronic).  Come and go as damaged nerves heal, or it may stay at the same level for years.  Cause emotional distress, loss of sleep, and a lower quality of life. What are the causes? The most common cause of this condition is diabetes. Many other diseases and conditions can also cause neuropathic pain. Causes of neuropathic pain can be classified as:  Toxic. This is caused by medicines and chemicals. The most common cause of toxic neuropathic pain is damage from cancer treatments (chemotherapy).  Metabolic. This can be caused by: ? Diabetes. This is the most common disease that damages the nerves. ? Lack of vitamin B from long-term alcohol abuse.  Traumatic. Any injury that cuts, crushes, or stretches a nerve can cause damage and pain. A common example is feeling pain after losing an arm or leg (phantom limb pain).  Compression-related. If a sensory nerve gets trapped or compressed for a long period of time, the blood supply to the nerve can be cut off.  Vascular. Many blood vessel diseases can cause neuropathic pain by decreasing blood supply and oxygen to  nerves.  Autoimmune. This type of pain results from diseases in which the body's defense system (immune system) mistakenly attacks sensory nerves. Examples of autoimmune diseases that can cause neuropathic pain include lupus and multiple sclerosis.  Infectious. Many types of viral infections can damage sensory nerves and cause pain. Shingles infection is a common cause of this type of pain.  Inherited. Neuropathic pain can be a symptom of many diseases that are passed down through families (genetic). What increases the risk? You are more likely to develop this condition if:  You have diabetes.  You smoke.  You drink too much alcohol.  You are taking certain medicines, including medicines that kill cancer cells (chemotherapy) or that treat immune system disorders. What are the signs or symptoms? The main symptom is pain. Neuropathic pain is often described as:  Burning.  Shock-like.  Stinging.  Hot or cold.  Itching. How is this diagnosed? No single test can diagnose neuropathic pain. It is diagnosed based on:  Physical exam and your symptoms. Your health care provider will ask you about your pain. You may be asked to use a pain scale to describe how bad your pain is.  Tests. These may be done to see if you have a high sensitivity to pain and to help find the cause and location of any sensory nerve damage. They include: ? Nerve conduction studies to test how well nerve signals travel through your sensory nerves (electrodiagnostic testing). ? Stimulating your sensory nerves through electrodes on your skin and measuring the response in  your spinal cord and brain (somatosensory evoked potential).  Imaging studies, such as: ? X-rays. ? CT scan. ? MRI. How is this treated? Treatment for neuropathic pain may change over time. You may need to try different treatment options or a combination of treatments. Some options include:  Treating the underlying cause of the neuropathy,  such as diabetes, kidney disease, or vitamin deficiencies.  Stopping medicines that can cause neuropathy, such as chemotherapy.  Medicine to relieve pain. Medicines may include: ? Prescription or over-the-counter pain medicine. ? Anti-seizure medicine. ? Antidepressant medicines. ? Pain-relieving patches that are applied to painful areas of skin. ? A medicine to numb the area (local anesthetic), which can be injected as a nerve block.  Transcutaneous nerve stimulation. This uses electrical currents to block painful nerve signals. The treatment is painless.  Alternative treatments, such as: ? Acupuncture. ? Meditation. ? Massage. ? Physical therapy. ? Pain management programs. ? Counseling. Follow these instructions at home: Medicines   Take over-the-counter and prescription medicines only as told by your health care provider.  Do not drive or use heavy machinery while taking prescription pain medicine.  If you are taking prescription pain medicine, take actions to prevent or treat constipation. Your health care provider may recommend that you: ? Drink enough fluid to keep your urine pale yellow. ? Eat foods that are high in fiber, such as fresh fruits and vegetables, whole grains, and beans. ? Limit foods that are high in fat and processed sugars, such as fried or sweet foods. ? Take an over-the-counter or prescription medicine for constipation. Lifestyle   Have a good support system at home.  Consider joining a chronic pain support group.  Do not use any products that contain nicotine or tobacco, such as cigarettes and e-cigarettes. If you need help quitting, ask your health care provider.  Do not drink alcohol. General instructions  Learn as much as you can about your condition.  Work closely with all your health care providers to find the treatment plan that works best for you.  Ask your health care provider what activities are safe for you.  Keep all follow-up  visits as told by your health care provider. This is important. Contact a health care provider if:  Your pain treatments are not working.  You are having side effects from your medicines.  You are struggling with tiredness (fatigue), mood changes, depression, or anxiety. Summary  Neuropathic pain is pain caused by damage to the nerves that are responsible for certain sensations in your body (sensory nerves).  Neuropathic pain may come and go as damaged nerves heal, or it may stay at the same level for years.  Neuropathic pain is usually a long-term condition that can be difficult to treat. Consider joining a chronic pain support group. This information is not intended to replace advice given to you by your health care provider. Make sure you discuss any questions you have with your health care provider. Document Released: 10/20/2003 Document Revised: 05/15/2018 Document Reviewed: 02/08/2017 Elsevier Patient Education  2020 [employer].

## 2019-02-11 ENCOUNTER — Telehealth: Payer: Self-pay | Admitting: Family Medicine

## 2019-02-11 ENCOUNTER — Encounter: Payer: Self-pay | Admitting: Family Medicine

## 2019-02-11 MED ORDER — NORTRIPTYLINE HCL 10 MG PO CAPS: 20.0000 mg | ORAL_CAPSULE | Freq: Every day | ORAL | 3 refills | Status: DC

## 2019-02-11 NOTE — Telephone Encounter (Signed)
Dose change from 25mg  qhs to 20mg  qhs. May take 10-20mg  qhs

## 2019-03-29 ENCOUNTER — Ambulatory Visit: Payer: Medicare Other | Attending: Internal Medicine

## 2019-03-29 DIAGNOSIS — Z23 Encounter for immunization: Secondary | ICD-10-CM | POA: Insufficient documentation

## 2019-03-29 NOTE — Progress Notes (Signed)
   Covid-19 Vaccination Clinic  Name:  Felice Lippitt    MRN: [id number] DOB: 15-Jun-1955  03/29/2019  Ms. Kelsall was observed post Covid-19 immunization for 15 minutes without incidence. She was provided with Vaccine Information Sheet and instruction to access the V-Safe system.   Ms. Smalls was instructed to call 911 with any severe reactions post vaccine: Marland Kitchen Difficulty breathing  . Swelling of your face and throat  . A fast heartbeat  . A bad rash all over your body  . Dizziness and weakness    Immunizations Administered    Name Date Dose VIS Date Route   Pfizer COVID-19 Vaccine 03/29/2019  1:59 PM 0.3 mL 01/16/2019 Intramuscular   Manufacturer: [place]   Lot: [id number]   [place]: [id number]

## 2019-04-22 ENCOUNTER — Ambulatory Visit: Payer: Medicare Other | Attending: Internal Medicine

## 2019-04-22 DIAGNOSIS — Z23 Encounter for immunization: Secondary | ICD-10-CM

## 2019-04-22 NOTE — Progress Notes (Signed)
   Covid-19 Vaccination Clinic  Name:  Rebecca Rose    MRN: [id number] DOB: July 31, 1955  04/22/2019  Rebecca Rose was observed post Covid-19 immunization for 15 minutes without incident. She was provided with Vaccine Information Sheet and instruction to access the V-Safe system.   Rebecca Rose was instructed to call 911 with any severe reactions post vaccine: Marland Kitchen Difficulty breathing  . Swelling of face and throat  . A fast heartbeat  . A bad rash all over body  . Dizziness and weakness   Immunizations Administered    Name Date Dose VIS Date Route   Pfizer COVID-19 Vaccine 04/22/2019  9:50 AM 0.3 mL 01/16/2019 Intramuscular   Manufacturer: [place]   Lot: [id number]   [place]: [id number]

## 2019-05-13 ENCOUNTER — Other Ambulatory Visit: Payer: Self-pay | Admitting: Neurology

## 2020-01-13 ENCOUNTER — Encounter: Payer: Self-pay | Admitting: Family Medicine

## 2020-01-13 ENCOUNTER — Ambulatory Visit: Payer: Medicare Other | Admitting: Family Medicine

## 2020-01-13 VITALS — BP 125/86 | HR 76 | Ht 66.0 in | Wt 160.8 lb

## 2020-01-13 DIAGNOSIS — G609 Hereditary and idiopathic neuropathy, unspecified: Secondary | ICD-10-CM | POA: Diagnosis not present

## 2020-01-13 DIAGNOSIS — Z8673 Personal history of transient ischemic attack (TIA), and cerebral infarction without residual deficits: Secondary | ICD-10-CM | POA: Diagnosis not present

## 2020-01-13 MED ORDER — GABAPENTIN 300 MG PO CAPS: ORAL_CAPSULE | ORAL | 3 refills | Status: DC

## 2020-01-13 NOTE — Progress Notes (Addendum)
Chief Complaint  Patient presents with  . Follow-up    Rm 5, neuropathy now sreading to feet/ankles last 2 months.      HISTORY OF PRESENT ILLNESS: Today 01/13/20  Rebecca Rose is a 64 y.o. female here today for follow up for neuropathy. She continues gabapentin 600mg  at 6pm and 300mg  at 9pm. She also continues nortriptyline 20mg  for neuropathic pain and insomnia. She reports that burning sensation is spreading. She is now experiencing electrical/burning sensation of bilateral calves that spreads just above both knees and to both ankles. Over the past few months, she started noticing pins and needles in both feet.   She continues to follow up with pain management. She has tried multiple procedures and medications to help manage low back pain. She has a significant history of degenerative changes. She has failed two lumbar fusions. She has failed ESI, Ketamine and multiple other pain medications. She is now taking tramadol 2 times daily (prescribed for up to 4 times a day). Thyroid has been well managed. She continues rosuvastatin. No recent TIA symptoms.     HISTORY (copied from previous note)  Rebecca Rose is a 64 y.o. female here today for follow up for idiopathic peripheral neuropathy. Numbness and tingling in feet continue but she is most bothered by leg pain. Pain is worsening over the past few months. Pain is sharp and feels like electricity in bilateral legs. Pain is all day but worse at night. She had a NCS in [place] and told that she did not have RLS. Has history of low back pain but does not feel this is related. She is failed Cymbalta and Lyrica in the past. She has tried RLS medications (unsure of the name) that did not help. She has fibromyalgia but feel this is under control at this time. She is taking gabapentin 600mg  at 6pm and 300mg  at bedtime (9-10pm). She is also taking nortriptyline 10mg  at bedtime that has helped some. Alpha lipoic acid did not help.  She is  managed by Dr Nelva Bush for pain management using injection therapy every 6 months. She takes Tramadol 1-2 tablets daily.   She continues rosuvastatin and asa 81mg  for stroke prevention. BP is normal.   HISTORY: (copied from my note on 04/01/2018)  Rebecca Rose a 64 y.o.femalehere today for follow up for idiopathic peripheral neuropathy. She continues gabapentin 600mg  in the evenings. She has tried to increase dose but suffers with daytime sleepiness. She was started on nortriptyline 10mg  at bedtime in 08/2017.She does feel that this helps some. It also helps her sleep but she continues to wake up in the middle of the night. 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 She has her adult son living with her who suffers from depression with suicidal ideations. Her husband is retired.  She had a "really bad headache" about a month ago that lasted for about 5 days. She reports that she had blurred vision and significant pain in her neck with headache. She has a history of migraines but states that she has not had one in years. She reports headache finally resolved after taking Tylenol. She denies vision loss, confusion, trouble speaking or weakness with headache. She does feel that her feeling is off on the bottoms of her feet and occasionally she will miss a step. She denies falls or trouble walking. She has participated in PT in the past but dies not feel it helps. She states that this headache event was  not similar to her previous TIA.  She is continuing Crestor and asa for stroke prevention.Recent labs with PCP were perfect with the exception of A1C of 5.7. She has gained about 8 pounds in the past 2-3 months.  HISTORY: (copied fromCarolyn Rose'snote on 09/04/2017) UPDATE7/31/2019CMMs.Mielke,64 year old female returns for follow-up with history of idiopathic peripheral neuropathy. She continues to complain with burning electric-like pain in both lower  extremities. She has failed Cymbalta and Lyrica in the past. She is currently on gabapentin 300 mg 1 morning and 2 (2)hours before bedtime.Unable to increase dose due to daytime drowsiness in the morning. Her neuropathy pain wakes her up at night and she has trouble sleeping.Past medical history osteoarthritis, fibromyalgia,posterior vitreous detachment both eyes, migrainesand TIA x2.She is currently on aspirin and Crestor for secondary stroke prevention. She returns for reevaluation.  09/04/16 Rebecca Rose a 64 y.o.femalehere as a referral from Dr. No ref. provider foundfor blurred vision and vision loss of left eye. Past medical history osteoarthritis, fibromyalgia,posterior vitreous detachment both eyes, migraines. Patient reports an episode of painless, complete loss of vision left eye in January which lasted about 3 minutes and self resolved. Has not recurred. Ophthalmologist does not feel this episode is explained by eye findings. Suggested evaluation for TIA or stroke.Happened in January, she was walking to the cafeteria and all of a sudden some pulled a curtain over her left eye, it was completely black, couldn;t see light or shaows. Lasted a minute and went away. She continued to have blurred vision and headaches. The blurred vision is betetr, headaches improved. The left eye stayed blurred for 3 weeks and daily headaches. Still with headaches occasionally. No pain on eye movement. The headache is an ache on the right. She has a history of migraines last one 22 years ago. She is not on a baby aspirin. No other focal neurologic deficits, associated symptoms, inciting events or modifiable factors.Echo showed grade 1 diastolic dysfunction and she was asked to follow up with primary care,but no thrombus or causes forTIAseen on echocardiogram.   REVIEW OF SYSTEMS: Out of a complete 14 system review of symptoms, the patient complains only of the following symptoms, neuropathic  pain, low back pain and all other reviewed systems are negative.   ALLERGIES: Allergies  Allergen Reactions  . Nsaids Other (See Comments)    Stomach ulcers  . Seasonal Ic  [Cholestatin]      HOME MEDICATIONS: Outpatient Medications Prior to Visit  Medication Sig Dispense Refill  . acetaminophen (TYLENOL) 650 MG CR tablet Take 650 mg by mouth 2 (two) times daily.     . Ascorbic Acid (VITAMIN C) 1000 MG tablet Take 1,000 mg by mouth daily.    Marland Kitchen aspirin EC 81 MG tablet Take 81 mg by mouth daily.    . Cholecalciferol (VITAMIN D3) 5000 units CAPS Take 5,000 Units by mouth daily.    . Coenzyme Q10 (COQ-10) 200 MG CAPS Take 200 mg by mouth daily. (Patient taking differently: Take 200 mg by mouth 2 (two) times daily. )  0  . famotidine (PEPCID) 10 MG tablet Take 10 mg by mouth 2 (two) times daily as needed for heartburn or indigestion.     Marland Kitchen levothyroxine (SYNTHROID, LEVOTHROID) 50 MCG tablet Take 50 mcg by mouth daily before breakfast.    . loratadine (CLARITIN) 10 MG tablet Take 10 mg by mouth daily.    . nortriptyline (PAMELOR) 10 MG capsule Take 2 capsules (20 mg total) by mouth at bedtime. 180 capsule 3  .  omeprazole (PRILOSEC) 40 MG capsule Take 40 mg by mouth 2 (two) times daily as needed (ulcers).    . rosuvastatin (CRESTOR) 10 MG tablet Take 10 mg by mouth every evening.     . traMADol (ULTRAM) 50 MG tablet Take 50 mg by mouth. Takes 2-3 per day prn.  (2x max usually)    . vitamin B-12 (CYANOCOBALAMIN) 1000 MCG tablet Take 1,000 mcg by mouth daily.     Marland Kitchen gabapentin (NEURONTIN) 300 MG capsule TAKE 1 CAPSULE THREE TIMES A DAY 270 capsule 3   No facility-administered medications prior to visit.     PAST MEDICAL HISTORY: Past Medical History:  Diagnosis Date  . Cataracts, bilateral   . Fibroleiomyoma   . Fibromyalgia   . Glaucoma   . History of stomach ulcers   . Neuropathy of leg   . Pneumonia    11/21  . Vision abnormalities      PAST SURGICAL HISTORY: Past Surgical  History:  Procedure Laterality Date  . bone spur toe left foot    . carpal tunnel both hands  2005-2006  . fusion lower back  11/2008  . fusion lower back  10/1998  . fusion to lower back  11/2010  . knee arthoscopy  1995/2000   five on right leg  . ulkna release right  2006     FAMILY HISTORY: Family History  Problem Relation Age of Onset  . Transient ischemic attack Father      SOCIAL HISTORY: Social History   Socioeconomic History  . Marital status: Married    Spouse name: Not on file  . Number of children: Not on file  . Years of education: Not on file  . Highest education level: Not on file  Occupational History  . Occupation: Art gallery manager  Tobacco Use  . Smoking status: Never Smoker  . Smokeless tobacco: Never Used  Vaping Use  . Vaping Use: Never used  Substance and Sexual Activity  . Alcohol use: No  . Drug use: No  . Sexual activity: Not on file  Other Topics Concern  . Not on file  Social History Narrative   Lives at home w/ her husband   Right-caffeine   Caffeine: 1 large cup of coffee per day   Social Determinants of Health   Financial Resource Strain:   . Difficulty of Paying Living Expenses: Not on file  Food Insecurity:   . Worried About Charity fundraiser in the Last Year: Not on file  . Ran Out of Food in the Last Year: Not on file  Transportation Needs:   . Lack of Transportation (Medical): Not on file  . Lack of Transportation (Non-Medical): Not on file  Physical Activity:   . Days of Exercise per Week: Not on file  . Minutes of Exercise per Session: Not on file  Stress:   . Feeling of Stress : Not on file  Social Connections:   . Frequency of Communication with Friends and Family: Not on file  . Frequency of Social Gatherings with Friends and Family: Not on file  . Attends Religious Services: Not on file  . Active Member of Clubs or Organizations: Not on file  . Attends Archivist Meetings: Not on file  . Marital  Status: Not on file  Intimate Partner Violence:   . Fear of Current or Ex-Partner: Not on file  . Emotionally Abused: Not on file  . Physically Abused: Not on file  . Sexually Abused: Not on  file      PHYSICAL EXAM  Vitals:   01/13/20 0739  BP: 125/86  Pulse: 76  Weight: 160 lb 12.8 oz (72.9 kg)  Height: 5\' 6"  (1.676 m)   Body mass index is 25.95 kg/m.   Generalized: Well developed, in no acute distress  Cardiology: normal rate and rhythm, no murmur auscultated  Respiratory: clear to auscultation bilaterally    Neurological examination  Mentation: Alert oriented to time, place, history taking. Follows all commands speech and language fluent Cranial nerve II-XII: Pupils were equal round reactive to light. Extraocular movements were full, visual field were full on confrontational test. Facial sensation and strength were normal. Head turning and shoulder shrug  were normal and symmetric. Motor: The motor testing reveals 5 over 5 strength of all 4 extremities. Good symmetric motor tone is noted throughout.  Sensory: Sensory testing is intact to soft touch and pinprick on all 4 extremities with exception of slight decreased sensation of medial portion of right foot. No evidence of extinction is noted.  Coordination: Cerebellar testing reveals good finger-nose-finger and heel-to-shin bilaterally.  Gait and station: Gait is normal.      DIAGNOSTIC DATA (LABS, IMAGING, TESTING) - I reviewed patient records, labs, notes, testing and imaging myself where available.  Lab Results  Component Value Date   WBC 5.6 09/21/2016   HGB 13.6 10/04/2016   HCT 40.0 10/04/2016   MCV 93.2 09/21/2016   PLT 231 09/21/2016      Component Value Date/Time   NA 141 10/04/2016 1207   NA 144 03/28/2016 0843   K 4.1 10/04/2016 1207   CL 104 10/04/2016 1207   CO2 22 09/21/2016 0914   GLUCOSE 83 10/04/2016 1207   BUN 11 10/04/2016 1207   BUN 10 03/28/2016 0843   CREATININE 0.70 10/04/2016 1207    CALCIUM 9.3 09/21/2016 0914   PROT 6.9 09/21/2016 0914   PROT 7.8 09/04/2016 1502   ALBUMIN 4.2 09/21/2016 0914   AST 32 09/21/2016 0914   ALT 17 09/21/2016 0914   ALKPHOS 62 09/21/2016 0914   BILITOT 0.6 09/21/2016 0914   GFRNONAA >60 09/21/2016 0914   GFRAA >60 09/21/2016 0914   Lab Results  Component Value Date   CHOL 198 09/21/2016   HDL 48 09/21/2016   LDLCALC 92 09/21/2016   TRIG 292 (H) 09/21/2016   CHOLHDL 4.1 09/21/2016   Lab Results  Component Value Date   HGBA1C 5.5 09/21/2016   Lab Results  Component Value Date   VITAMINB12 >2000 (H) 09/04/2016   Lab Results  Component Value Date   TSH 10.130 (H) 09/04/2016      ASSESSMENT AND PLAN  64 y.o. year old female  has a past medical history of Cataracts, bilateral, Fibroleiomyoma, Fibromyalgia, Glaucoma, History of stomach ulcers, Neuropathy of leg, Pneumonia, and Vision abnormalities. here with   Idiopathic peripheral neuropathy - Plan: gabapentin (NEURONTIN) 300 MG capsule, DISCONTINUED: gabapentin (NEURONTIN) 300 MG capsule  History of transient ischemic attack (TIA)   Nayah has noted worsening of peripheral neuropathy over the past year.  Pain has spread to just above her knees and below the ankle bilaterally.  We will increase gabapentin to 300 mg in the morning, 300 mg at lunch and 600 mg at dinner.  She will continue nortriptyline 20 mg at bedtime.  We have also discussed using over-the-counter capsaicin patches to help with symptomatic care.  Healthy lifestyle habits encouraged.  Stroke prevention discussed.  She will continue close follow-up with primary care.  She will follow-up with me in 6 months, sooner if needed.  She verbalizes understanding and agreement with this plan.  I spent 20 minutes of face-to-face and non-face-to-face time with patient.  This included previsit chart review, lab review, study review, order entry, electronic health record documentation, patient education.    Debbora Presto,  MSN, FNP-C 01/13/2020, 8:52 AM  [facility] [address] [place], [place] [address] [phone]   Made any corrections needed, and agree with history, physical, neuro exam,assessment and plan as stated.     Sarina Ill, MD [facility]

## 2020-01-13 NOTE — Patient Instructions (Signed)
Below is our plan:  We will increase gabapentin dose to 300mg  in the morning, 300mg  at lunch and 600mg  at bedtime. Continue nortriptyline 20mg  daily. Consider OTC capsaicin patch as needed.   Please make sure you are staying well hydrated. I recommend 50-60 ounces daily. Well balanced diet and regular exercise encouraged.    Please continue follow up with care team as directed.   Follow up in 6 months   You may receive a survey regarding today's visit. I encourage you to leave honest feed back as I do use this information to improve patient care. Thank you for seeing me today!      Peripheral Neuropathy Peripheral neuropathy is a type of nerve damage. It affects nerves that carry signals between the spinal cord and the arms, legs, and the rest of the body (peripheral nerves). It does not affect nerves in the spinal cord or brain. In peripheral neuropathy, one nerve or a group of nerves may be damaged. Peripheral neuropathy is a broad category that includes many specific nerve disorders, like diabetic neuropathy, hereditary neuropathy, and carpal tunnel syndrome. What are the causes? This condition may be caused by:  Diabetes. This is the most common cause of peripheral neuropathy.  Nerve injury.  Pressure or stress on a nerve that lasts a long time.  Lack (deficiency) of B vitamins. This can result from alcoholism, poor diet, or a restricted diet.  Infections.  Autoimmune diseases, such as rheumatoid arthritis and systemic lupus erythematosus.  Nerve diseases that are passed from parent to child (inherited).  Some medicines, such as cancer medicines (chemotherapy).  Poisonous (toxic) substances, such as lead and mercury.  Too little blood flowing to the legs.  Kidney disease.  Thyroid disease. In some cases, the cause of this condition is not known. What are the signs or symptoms? Symptoms of this condition depend on which of your nerves is damaged. Common symptoms  include:  Loss of feeling (numbness) in the feet, hands, or both.  Tingling in the feet, hands, or both.  Burning pain.  Very sensitive skin.  Weakness.  Not being able to move a part of the body (paralysis).  Muscle twitching.  Clumsiness or poor coordination.  Loss of balance.  Not being able to control your bladder.  Feeling dizzy.  Sexual problems. How is this diagnosed? Diagnosing and finding the cause of peripheral neuropathy can be difficult. Your health care provider will take your medical history and do a physical exam. A neurological exam will also be done. This involves checking things that are affected by your brain, spinal cord, and nerves (nervous system). For example, your health care provider will check your reflexes, how you move, and what you can feel. You may have other tests, such as:  Blood tests.  Electromyogram (EMG) and nerve conduction tests. These tests check nerve function and how well the nerves are controlling the muscles.  Imaging tests, such as CT scans or MRI to rule out other causes of your symptoms.  Removing a small piece of nerve to be examined in a lab (nerve biopsy). This is rare.  Removing and examining a small amount of the fluid that surrounds the brain and spinal cord (lumbar puncture). This is rare. How is this treated? Treatment for this condition may involve:  Treating the underlying cause of the neuropathy, such as diabetes, kidney disease, or vitamin deficiencies.  Stopping medicines that can cause neuropathy, such as chemotherapy.  Medicine to relieve pain. Medicines may include: ? Prescription  or over-the-counter pain medicine. ? Antiseizure medicine. ? Antidepressants. ? Pain-relieving patches that are applied to painful areas of skin.  Surgery to relieve pressure on a nerve or to destroy a nerve that is causing pain.  Physical therapy to help improve movement and balance.  Devices to help you move around  (assistive devices). Follow these instructions at home: Medicines  Take over-the-counter and prescription medicines only as told by your health care provider. Do not take any other medicines without first asking your health care provider.  Do not drive or use heavy machinery while taking prescription pain medicine. Lifestyle   Do not use any products that contain nicotine or tobacco, such as cigarettes and e-cigarettes. Smoking keeps blood from reaching damaged nerves. If you need help quitting, ask your health care provider.  Avoid or limit alcohol. Too much alcohol can cause a vitamin B deficiency, and vitamin B is needed for healthy nerves.  Eat a healthy diet. This includes: ? Eating foods that are high in fiber, such as fresh fruits and vegetables, whole grains, and beans. ? Limiting foods that are high in fat and processed sugars, such as fried or sweet foods. General instructions   If you have diabetes, work closely with your health care provider to keep your blood sugar under control.  If you have numbness in your feet: ? Check every day for signs of injury or infection. Watch for redness, warmth, and swelling. ? Wear padded socks and comfortable shoes. These help protect your feet.  Develop a good support system. Living with peripheral neuropathy can be stressful. Consider talking with a mental health specialist or joining a support group.  Use assistive devices and attend physical therapy as told by your health care provider. This may include using a walker or a cane.  Keep all follow-up visits as told by your health care provider. This is important. Contact a health care provider if:  You have new signs or symptoms of peripheral neuropathy.  You are struggling emotionally from dealing with peripheral neuropathy.  Your pain is not well-controlled. Get help right away if:  You have an injury or infection that is not healing normally.  You develop new weakness in an  arm or leg.  You fall frequently. Summary  Peripheral neuropathy is when the nerves in the arms, or legs are damaged, resulting in numbness, weakness, or pain.  There are many causes of peripheral neuropathy, including diabetes, pinched nerves, vitamin deficiencies, autoimmune disease, and hereditary conditions.  Diagnosing and finding the cause of peripheral neuropathy can be difficult. Your health care provider will take your medical history, do a physical exam, and do tests, including blood tests and nerve function tests.  Treatment involves treating the underlying cause of the neuropathy and taking medicines to help control pain. Physical therapy and assistive devices may also help. This information is not intended to replace advice given to you by your health care provider. Make sure you discuss any questions you have with your health care provider. Document Revised: 01/04/2017 Document Reviewed: 04/02/2016 Elsevier Patient Education  2020 [employer].

## 2020-02-02 ENCOUNTER — Encounter: Payer: Self-pay | Admitting: Family Medicine

## 2020-02-02 ENCOUNTER — Other Ambulatory Visit: Payer: Self-pay | Admitting: *Deleted

## 2020-02-02 MED ORDER — NORTRIPTYLINE HCL 10 MG PO CAPS: 20.0000 mg | ORAL_CAPSULE | Freq: Every day | ORAL | 3 refills | Status: DC

## 2020-06-06 NOTE — Patient Instructions (Signed)
Below is our plan:  We will continue gabapentin 600mg  twice daily and nortriptyline 20mg  daily. We will try a steroid taper for new leg pain. I feel this is most likely related to your back. If it does not help, please call PCP or Dr Nelva Bush for an evaluation.   Please make sure you are staying well hydrated. I recommend 50-60 ounces daily. Well balanced diet and regular exercise encouraged. Consistent sleep schedule with 6-8 hours recommended.   Please continue follow up with care team as directed.   Follow up with me in 6-12 months   You may receive a survey regarding today's visit. I encourage you to leave honest feed back as I do use this information to improve patient care. Thank you for seeing me today!     Lumbosacral Radiculopathy Lumbosacral radiculopathy is a condition that involves the spinal nerves and nerve roots in the low back and bottom of the spine. The condition develops when these nerves and nerve roots move out of place or become inflamed and cause symptoms. What are the causes? This condition may be caused by:  Pressure from a disk that bulges out of place (herniated disk). A disk is a plate of soft cartilage that separates bones in the spine.  Disk changes that occur with age (disk degeneration).  A narrowing of the bones of the lower back (spinal stenosis).  A tumor.  An infection.  An injury that places sudden pressure on the disks that cushion the bones of your lower spine. What increases the risk? You are more likely to develop this condition if:  You are a female who is 48-60 years old.  You are a female who is 61-52 years old.  You use improper technique when lifting things.  You are overweight or live a sedentary lifestyle.  You smoke.  Your work requires frequent lifting.  You do repetitive activities that strain the spine. What are the signs or symptoms? Symptoms of this condition include:  Pain that goes down from your back into your legs  (sciatica), usually on one side of the body. This is the most common symptom. The pain may be worse with sitting, coughing, or sneezing.  Pain and numbness in your legs.  Muscle weakness.  Tingling.  Loss of bladder control or bowel control.   How is this diagnosed? This condition may be diagnosed based on:  Your symptoms and medical history.  A physical exam. If the pain is lasting, you may have tests, such as:  MRI scan.  X-ray.  CT scan.  A type of X-ray used to examine the spinal canal after injecting a dye into your spine (myelogram).  A test to measure how electrical impulses move through a nerve (nerve conduction study). How is this treated? Treatment may depend on the cause of the condition and may include:  Working with a physical therapist.  Taking pain medicine.  Applying heat and ice to affected areas.  Doing stretches to improve flexibility.  Doing exercises to strengthen back muscles.  Having chiropractic spinal manipulation.  Using transcutaneous electrical nerve stimulation (TENS) therapy.  Getting a steroid injection in the spine. In some cases, no treatment is needed. If the condition is long-lasting (chronic), or if symptoms are severe, treatment may involve surgery or lifestyle changes, such as following a weight-loss plan. Follow these instructions at home: Activity  Avoid bending and other activities that make the problem worse.  Maintain a proper position when standing or sitting: ? When standing,  keep your upper back and neck straight, with your shoulders pulled back. Avoid slouching. ? When sitting, keep your back straight and relax your shoulders. Do not round your shoulders or pull them backward.  Do not sit or stand in one place for long periods of time.  Take brief periods of rest throughout the day. This will reduce your pain. It is usually better to rest by lying down or standing, not sitting.  When you are resting for longer  periods, mix in some mild activity or stretching between periods of rest. This will help to prevent stiffness and pain.  Get regular exercise. Ask your health care provider what activities are safe for you. If you were shown how to do any exercises or stretches, do them as directed by your health care provider.  Do not lift anything that is heavier than 10 lb (4.5 kg) or the limit that you are told by your health care provider. Always use proper lifting technique, which includes: ? Bending your knees. ? Keeping the load close to your body. ? Avoiding twisting. Managing pain  If directed, put ice on the affected area: ? Put ice in a plastic bag. ? Place a towel between your skin and the bag. ? Leave the ice on for 20 minutes, 2-3 times a day.  If directed, apply heat to the affected area as often as told by your health care provider. Use the heat source that your health care provider recommends, such as a moist heat pack or a heating pad. ? Place a towel between your skin and the heat source. ? Leave the heat on for 20-30 minutes. ? Remove the heat if your skin turns bright red. This is especially important if you are unable to feel pain, heat, or cold. You may have a greater risk of getting burned.  Take over-the-counter and prescription medicines only as told by your health care provider. General instructions  Sleep on a firm mattress in a comfortable position. Try lying on your side with your knees slightly bent. If you lie on your back, put a pillow under your knees.  Do not drive or use heavy machinery while taking prescription pain medicine.  If your health care provider prescribed a diet or exercise program, follow it as directed.  Keep all follow-up visits as told by your health care provider. This is important. Contact a health care provider if:  Your pain does not improve over time, even when taking pain medicines. Get help right away if:  You develop severe pain.  Your  pain suddenly gets worse.  You develop increasing weakness in your legs.  You lose the ability to control your bladder or bowel.  You have difficulty walking or balancing.  You have a fever. Summary  Lumbosacral radiculopathy is a condition that occurs when the spinal nerves and nerve roots in the lower part of the spine move out of place or become inflamed and cause symptoms.  Symptoms include pain, numbness, and tingling that go down from your back into your legs (sciatica), muscle weakness, and loss of bladder control or bowel control.  If directed, apply ice or heat to the affected area as told by your health care provider.  Follow instructions about activity, rest, and proper lifting technique. This information is not intended to replace advice given to you by your health care provider. Make sure you discuss any questions you have with your health care provider. Document Revised: 12/03/2019 Document Reviewed: 12/03/2019 Elsevier Patient Education  [place].    Peripheral Neuropathy Peripheral neuropathy is a type of nerve damage. It affects nerves that carry signals between the spinal cord and the arms, legs, and the rest of the body (peripheral nerves). It does not affect nerves in the spinal cord or brain. In peripheral neuropathy, one nerve or a group of nerves may be damaged. Peripheral neuropathy is a broad category that includes many specific nerve disorders, like diabetic neuropathy, hereditary neuropathy, and carpal tunnel syndrome. What are the causes? This condition may be caused by:  Diabetes. This is the most common cause of peripheral neuropathy.  Nerve injury.  Pressure or stress on a nerve that lasts a long time.  Lack (deficiency) of B vitamins. This can result from alcoholism, poor diet, or a restricted diet.  Infections.  Autoimmune diseases, such as rheumatoid arthritis and systemic lupus erythematosus.  Nerve diseases that are passed from  parent to child (inherited).  Some medicines, such as cancer medicines (chemotherapy).  Poisonous (toxic) substances, such as lead and mercury.  Too little blood flowing to the legs.  Kidney disease.  Thyroid disease. In some cases, the cause of this condition is not known. What are the signs or symptoms? Symptoms of this condition depend on which of your nerves is damaged. Common symptoms include:  Loss of feeling (numbness) in the feet, hands, or both.  Tingling in the feet, hands, or both.  Burning pain.  Very sensitive skin.  Weakness.  Not being able to move a part of the body (paralysis).  Muscle twitching.  Clumsiness or poor coordination.  Loss of balance.  Not being able to control your bladder.  Feeling dizzy.  Sexual problems. How is this diagnosed? Diagnosing and finding the cause of peripheral neuropathy can be difficult. Your health care provider will take your medical history and do a physical exam. A neurological exam will also be done. This involves checking things that are affected by your brain, spinal cord, and nerves (nervous system). For example, your health care provider will check your reflexes, how you move, and what you can feel. You may have other tests, such as:  Blood tests.  Electromyogram (EMG) and nerve conduction tests. These tests check nerve function and how well the nerves are controlling the muscles.  Imaging tests, such as CT scans or MRI to rule out other causes of your symptoms.  Removing a small piece of nerve to be examined in a lab (nerve biopsy).  Removing and examining a small amount of the fluid that surrounds the brain and spinal cord (lumbar puncture). How is this treated? Treatment for this condition may involve:  Treating the underlying cause of the neuropathy, such as diabetes, kidney disease, or vitamin deficiencies.  Stopping medicines that can cause neuropathy, such as chemotherapy.  Medicine to help  relieve pain. Medicines may include: ? Prescription or over-the-counter pain medicine. ? Antiseizure medicine. ? Antidepressants. ? Pain-relieving patches that are applied to painful areas of skin.  Surgery to relieve pressure on a nerve or to destroy a nerve that is causing pain.  Physical therapy to help improve movement and balance.  Devices to help you move around (assistive devices). Follow these instructions at home: Medicines  Take over-the-counter and prescription medicines only as told by your health care provider. Do not take any other medicines without first asking your health care provider.  Do not drive or use heavy machinery while taking prescription pain medicine. Lifestyle  Do not use any products that contain  nicotine or tobacco, such as cigarettes and e-cigarettes. Smoking keeps blood from reaching damaged nerves. If you need help quitting, ask your health care provider.  Avoid or limit alcohol. Too much alcohol can cause a vitamin B deficiency, and vitamin B is needed for healthy nerves.  Eat a healthy diet. This includes: ? Eating foods that are high in fiber, such as fresh fruits and vegetables, whole grains, and beans. ? Limiting foods that are high in fat and processed sugars, such as fried or sweet foods.   General instructions  If you have diabetes, work closely with your health care provider to keep your blood sugar under control.  If you have numbness in your feet: ? Check every day for signs of injury or infection. Watch for redness, warmth, and swelling. ? Wear padded socks and comfortable shoes. These help protect your feet.  Develop a good support system. Living with peripheral neuropathy can be stressful. Consider talking with a mental health specialist or joining a support group.  Use assistive devices and attend physical therapy as told by your health care provider. This may include using a walker or a cane.  Keep all follow-up visits as told by  your health care provider. This is important.   Contact a health care provider if:  You have new signs or symptoms of peripheral neuropathy.  You are struggling emotionally from dealing with peripheral neuropathy.  Your pain is not well-controlled. Get help right away if:  You have an injury or infection that is not healing normally.  You develop new weakness in an arm or leg.  You have fallen or do so frequently. Summary  Peripheral neuropathy is when the nerves in the arms, or legs are damaged, resulting in numbness, weakness, or pain.  There are many causes of peripheral neuropathy, including diabetes, pinched nerves, vitamin deficiencies, autoimmune disease, and hereditary conditions.  Diagnosing and finding the cause of peripheral neuropathy can be difficult. Your health care provider will take your medical history, do a physical exam, and do tests, including blood tests and nerve function tests.  Treatment involves treating the underlying cause of the neuropathy and taking medicines to help control pain. Physical therapy and assistive devices may also help. This information is not intended to replace advice given to you by your health care provider. Make sure you discuss any questions you have with your health care provider. Document Revised: 11/03/2019 Document Reviewed: 11/03/2019 Elsevier Patient Education  2021 [employer].

## 2020-06-06 NOTE — Progress Notes (Signed)
Chief Complaint  Patient presents with  . Follow-up    RM 2 alone Pt is having a new worsening pain in R leg that started about 6 weeks ago      HISTORY OF PRESENT ILLNESS: 06/07/20 ALL:  She returns for follow up for idiopathic neuropathy. We increased gabapentin to 300/300/600 and continued nortriptyline 20mg  daily at last visit in 01/2020. She reports that she could not tolerate side effects. She has continued 600mg  at 6pm and 600mg  at bedtime. Neuropathy pain is unchanged. Pain waxes and wanes.   She was diagnosed with Covid in 01/2020. She had pneumonia in March 2022. After recovering, she has noted a sharp pain that starts in her calf and radiates upward to the back of her thigh, turns to a throbbing pain by end of the day. Worse when sitting. Not burning or tingling. Feels different from neuropathy pain. Her back has been hurting more than normal over the same period of time. She feels that she has more fibromyalgia pain following Covid. She hurts all over. She has a new grandson on the way and wants to go to [place] to help care for him. She denies weakness. No falls. No changes in gait. She can not take Nsaids due to history of gastric ulcers. She is followed by Dr Nelva Bush with [facility].    01/13/2020 ALL:  Rebecca Rose is a 65 y.o. female here today for follow up for neuropathy. She continues gabapentin 600mg  at 6pm and 300mg  at 9pm. She also continues nortriptyline 20mg  for neuropathic pain and insomnia. She reports that burning sensation is spreading. She is now experiencing electrical/burning sensation of bilateral calves that spreads just above both knees and to both ankles. Over the past few months, she started noticing pins and needles in both feet.   She continues to follow up with pain management. She has tried multiple procedures and medications to help manage low back pain. She has a significant history of degenerative changes. She has failed two lumbar fusions. She has failed  ESI, Ketamine and multiple other pain medications. She is now taking tramadol 2 times daily (prescribed for up to 4 times a day). Thyroid has been well managed. She continues rosuvastatin. No recent TIA symptoms.    HISTORY (copied from previous note)  Rebecca Rose is a 65 y.o. female here today for follow up for idiopathic peripheral neuropathy. Numbness and tingling in feet continue but she is most bothered by leg pain. Pain is worsening over the past few months. Pain is sharp and feels like electricity in bilateral legs. Pain is all day but worse at night. She had a NCS in [place] and told that she did not have RLS. Has history of low back pain but does not feel this is related. She is failed Cymbalta and Lyrica in the past. She has tried RLS medications (unsure of the name) that did not help. She has fibromyalgia but feel this is under control at this time. She is taking gabapentin 600mg  at 6pm and 300mg  at bedtime (9-10pm). She is also taking nortriptyline 10mg  at bedtime that has helped some. Alpha lipoic acid did not help.  She is managed by Dr Nelva Bush for pain management using injection therapy every 6 months. She takes Tramadol 1-2 tablets daily.   She continues rosuvastatin and asa 81mg  for stroke prevention. BP is normal.   HISTORY: (copied from my note on 04/01/2018)  Rebecca Rose a 65 y.o.femalehere today for follow up for idiopathic peripheral neuropathy.  She continues gabapentin 600mg  in the evenings. She has tried to increase dose but suffers with daytime sleepiness. She was started on nortriptyline 10mg  at bedtime in 08/2017.She does feel that this helps some. It also helps her sleep but she continues to wake up in the middle of the night. 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 She has her adult son living with her who suffers from depression with suicidal ideations. Her husband is retired.  She had a "really bad headache" about a month ago that  lasted for about 5 days. She reports that she had blurred vision and significant pain in her neck with headache. She has a history of migraines but states that she has not had one in years. She reports headache finally resolved after taking Tylenol. She denies vision loss, confusion, trouble speaking or weakness with headache. She does feel that her feeling is off on the bottoms of her feet and occasionally she will miss a step. She denies falls or trouble walking. She has participated in PT in the past but dies not feel it helps. She states that this headache event was not similar to her previous TIA.  She is continuing Crestor and asa for stroke prevention.Recent labs with PCP were perfect with the exception of A1C of 5.7. She has gained about 8 pounds in the past 2-3 months.  HISTORY: (copied fromCarolyn Rose'snote on 09/04/2017) UPDATE7/31/2019CMMs.[name],65 year old female returns for follow-up with history of idiopathic peripheral neuropathy. She continues to complain with burning electric-like pain in both lower extremities. She has failed Cymbalta and Lyrica in the past. She is currently on gabapentin 300 mg 1 morning and 2 (2)hours before bedtime.Unable to increase dose due to daytime drowsiness in the morning. Her neuropathy pain wakes her up at night and she has trouble sleeping.Past medical history osteoarthritis, fibromyalgia,posterior vitreous detachment both eyes, migrainesand TIA x2.She is currently on aspirin and Crestor for secondary stroke prevention. She returns for reevaluation.  09/04/16 Rebecca Rose a 65 y.o.femalehere as a referral from Dr. No ref. provider foundfor blurred vision and vision loss of left eye. Past medical history osteoarthritis, fibromyalgia,posterior vitreous detachment both eyes, migraines. Patient reports an episode of painless, complete loss of vision left eye in January which lasted about 3 minutes and self resolved. Has not  recurred. Ophthalmologist does not feel this episode is explained by eye findings. Suggested evaluation for TIA or stroke.Happened in January, she was walking to the cafeteria and all of a sudden some pulled a curtain over her left eye, it was completely black, couldn;t see light or shaows. Lasted a minute and went away. She continued to have blurred vision and headaches. The blurred vision is betetr, headaches improved. The left eye stayed blurred for 3 weeks and daily headaches. Still with headaches occasionally. No pain on eye movement. The headache is an ache on the right. She has a history of migraines last one 22 years ago. She is not on a baby aspirin. No other focal neurologic deficits, associated symptoms, inciting events or modifiable factors.Echo showed grade 1 diastolic dysfunction and she was asked to follow up with primary care,but no thrombus or causes forTIAseen on echocardiogram.   REVIEW OF SYSTEMS: Out of a complete 14 system review of symptoms, the patient complains only of the following symptoms, neuropathic pain, low back pain, right leg pain and all other reviewed systems are negative.   ALLERGIES: Allergies  Allergen Reactions  . Nsaids Other (See Comments)  Stomach ulcers  . Seasonal Ic  [Cholestatin]      HOME MEDICATIONS: Outpatient Medications Prior to Visit  Medication Sig Dispense Refill  . acetaminophen (TYLENOL) 650 MG CR tablet Take 650 mg by mouth 2 (two) times daily.    Marland Kitchen ALPRAZolam (XANAX) 0.5 MG tablet Take 0.5 tablets by mouth 2 (two) times daily.    . Ascorbic Acid (VITAMIN C) 1000 MG tablet Take 1,000 mg by mouth daily.    Marland Kitchen aspirin EC 81 MG tablet Take 81 mg by mouth daily.    . Cholecalciferol (VITAMIN D3) 5000 units CAPS Take 5,000 Units by mouth daily.    . Coenzyme Q10 (COQ-10) 200 MG CAPS Take 200 mg by mouth daily. (Patient taking differently: Take 200 mg by mouth 2 (two) times daily.)  0  . famotidine (PEPCID) 10 MG tablet Take 10 mg by  mouth 2 (two) times daily as needed for heartburn or indigestion.     . fexofenadine (ALLEGRA) 180 MG tablet Take 180 mg by mouth daily.    Marland Kitchen gabapentin (NEURONTIN) 300 MG capsule Take 300mg  in am, 300mg  at lunch and 600mg  at bedtime. 360 capsule 3  . levOCARNitine (L-CARNITINE PO) Take 1,000 mg by mouth.    . levothyroxine (SYNTHROID, LEVOTHROID) 50 MCG tablet Take 50 mcg by mouth daily before breakfast.    . nortriptyline (PAMELOR) 10 MG capsule Take 2 capsules (20 mg total) by mouth at bedtime. 180 capsule 3  . rosuvastatin (CRESTOR) 10 MG tablet Take 10 mg by mouth every evening.     . traMADol (ULTRAM) 50 MG tablet Take 50 mg by mouth. Takes 2-3 per day prn.  (2x max usually)    . vitamin B-12 (CYANOCOBALAMIN) 1000 MCG tablet Take 1,000 mcg by mouth daily.     Marland Kitchen loratadine (CLARITIN) 10 MG tablet Take 10 mg by mouth daily.    Marland Kitchen omeprazole (PRILOSEC) 40 MG capsule Take 40 mg by mouth 2 (two) times daily as needed (ulcers).     No facility-administered medications prior to visit.     PAST MEDICAL HISTORY: Past Medical History:  Diagnosis Date  . Cataracts, bilateral   . Fibroleiomyoma   . Fibromyalgia   . Glaucoma   . History of stomach ulcers   . Neuropathy of leg   . Pneumonia    11/21  . Vision abnormalities      PAST SURGICAL HISTORY: Past Surgical History:  Procedure Laterality Date  . bone spur toe left foot    . carpal tunnel both hands  2005-2006  . fusion lower back  11/2008  . fusion lower back  10/1998  . fusion to lower back  11/2010  . knee arthoscopy  1995/2000   five on right leg  . ulkna release right  2006     FAMILY HISTORY: Family History  Problem Relation Age of Onset  . Transient ischemic attack Father      SOCIAL HISTORY: Social History   Socioeconomic History  . Marital status: Married    Spouse name: Not on file  . Number of children: Not on file  . Years of education: Not on file  . Highest education level: Not on file   Occupational History  . Occupation: Art gallery manager  Tobacco Use  . Smoking status: Never Smoker  . Smokeless tobacco: Never Used  Vaping Use  . Vaping Use: Never used  Substance and Sexual Activity  . Alcohol use: No  . Drug use: No  . Sexual activity:  Not on file  Other Topics Concern  . Not on file  Social History Narrative   Lives at home w/ her husband   Right-caffeine   Caffeine: 1 large cup of coffee per day   Social Determinants of Health   Financial Resource Strain: Not on file  Food Insecurity: Not on file  Transportation Needs: Not on file  Physical Activity: Not on file  Stress: Not on file  Social Connections: Not on file  Intimate Partner Violence: Not on file      PHYSICAL EXAM  Vitals:   06/07/20 0820  Weight: 165 lb (74.8 kg)  Height: 5\' 5"  (1.651 m)   Body mass index is 27.46 kg/m.   Generalized: Well developed, in no acute distress  Cardiology: normal rate and rhythm, no murmur auscultated  Respiratory: clear to auscultation bilaterally    Neurological examination  Mentation: Alert oriented to time, place, history taking. Follows all commands speech and language fluent Cranial nerve II-XII: Pupils were equal round reactive to light. Extraocular movements were full, visual field were full on confrontational test. Facial sensation and strength were normal. Head turning and shoulder shrug  were normal and symmetric. Motor: The motor testing reveals 5 over 5 strength of all 4 extremities. Good symmetric motor tone is noted throughout.  Sensory: Sensory testing is intact to soft touch and pinprick on all 4 extremities with exception of slight decreased sensation of medial portion of right foot. No evidence of extinction is noted.  Coordination: Cerebellar testing reveals good finger-nose-finger and heel-to-shin bilaterally.  Gait and station: Gait is normal.      DIAGNOSTIC DATA (LABS, IMAGING, TESTING) - I reviewed patient records, labs, notes,  testing and imaging myself where available.  Lab Results  Component Value Date   WBC 5.6 09/21/2016   HGB 13.6 10/04/2016   HCT 40.0 10/04/2016   MCV 93.2 09/21/2016   PLT 231 09/21/2016      Component Value Date/Time   NA 141 10/04/2016 1207   NA 144 03/28/2016 0843   K 4.1 10/04/2016 1207   CL 104 10/04/2016 1207   CO2 22 09/21/2016 0914   GLUCOSE 83 10/04/2016 1207   BUN 11 10/04/2016 1207   BUN 10 03/28/2016 0843   CREATININE 0.70 10/04/2016 1207   CALCIUM 9.3 09/21/2016 0914   PROT 6.9 09/21/2016 0914   PROT 7.8 09/04/2016 1502   ALBUMIN 4.2 09/21/2016 0914   AST 32 09/21/2016 0914   ALT 17 09/21/2016 0914   ALKPHOS 62 09/21/2016 0914   BILITOT 0.6 09/21/2016 0914   GFRNONAA >60 09/21/2016 0914   GFRAA >60 09/21/2016 0914   Lab Results  Component Value Date   CHOL 198 09/21/2016   HDL 48 09/21/2016   LDLCALC 92 09/21/2016   TRIG 292 (H) 09/21/2016   CHOLHDL 4.1 09/21/2016   Lab Results  Component Value Date   HGBA1C 5.5 09/21/2016   Lab Results  Component Value Date   VITAMINB12 >2000 (H) 09/04/2016   Lab Results  Component Value Date   TSH 10.130 (H) 09/04/2016      ASSESSMENT AND PLAN  65 y.o. year old female  has a past medical history of Cataracts, bilateral, Fibroleiomyoma, Fibromyalgia, Glaucoma, History of stomach ulcers, Neuropathy of leg, Pneumonia, and Vision abnormalities. here with   Idiopathic peripheral neuropathy   [place] was unable to tolerate change in gabapentin times and has continued gabapentin 600mg  at 6pm and 600mg  at bedtime. We will continue gabapentin 600mg  BID and nortriptyline 20mg   daily. She is having a new pain in the right calf and thigh. Her back pain has also been worse during same period of time. I will start a prednisone taper to see if this provides relief. I do not think new pain is consistent with worsening neuropathy. If pain does not respond to prednisone, I would like for her to be seen by Dr Nelva Bush. May consider  repeat EMG if no other etiology found. Healthy lifestyle habits encouraged.  Stroke prevention discussed.  She will continue close follow-up with primary care.  She will follow-up with me in 6 months, sooner if needed.  She verbalizes understanding and agreement with this plan.     Debbora Presto, MSN, FNP-C 06/07/2020, 8:29 AM  [facility] [address] [place], [place] [address] [phone]

## 2020-06-07 ENCOUNTER — Encounter: Payer: Self-pay | Admitting: Family Medicine

## 2020-06-07 ENCOUNTER — Ambulatory Visit: Payer: Medicare Other | Admitting: Family Medicine

## 2020-06-07 VITALS — BP 107/76 | HR 89 | Ht 65.0 in | Wt 165.0 lb

## 2020-06-07 DIAGNOSIS — G609 Hereditary and idiopathic neuropathy, unspecified: Secondary | ICD-10-CM

## 2020-06-07 MED ORDER — PREDNISONE 10 MG (21) PO TBPK: ORAL_TABLET | ORAL | 0 refills | Status: DC

## 2020-07-03 ENCOUNTER — Other Ambulatory Visit: Payer: Self-pay | Admitting: Neurology

## 2020-07-03 DIAGNOSIS — G609 Hereditary and idiopathic neuropathy, unspecified: Secondary | ICD-10-CM

## 2021-03-02 ENCOUNTER — Other Ambulatory Visit: Payer: Self-pay | Admitting: *Deleted

## 2021-03-02 MED ORDER — NORTRIPTYLINE HCL 10 MG PO CAPS: 20.0000 mg | ORAL_CAPSULE | Freq: Every day | ORAL | 0 refills | Status: DC

## 2021-03-09 NOTE — Patient Instructions (Addendum)
Below is our plan:  We will continue gabapentin 600mg  twice daily and nortriptyline 20mg  at bedtime. Keep a close eye on toe numbness and worsening pain. We can try a neuropathy cream if you wish. You can continue lidocaine or capsaicin over the counter as well. We can recheck b12 levels if you wish. Follow up closely with PCP.   Please make sure you are staying well hydrated. I recommend 50-60 ounces daily. Well balanced diet and regular exercise encouraged. Consistent sleep schedule with 6-8 hours recommended.   Please continue follow up with care team as directed.   Follow up with me in 1 year   You may receive a survey regarding today's visit. I encourage you to leave honest feed back as I do use this information to improve patient care. Thank you for seeing me today!

## 2021-03-09 NOTE — Progress Notes (Signed)
PATIENT: Rebecca Rose DOB: May 28, 1955  REASON FOR VISIT: follow up HISTORY FROM: patient  Virtual Visit via Telephone Note  I connected with Kathrynn Speed on 03/10/21 at 11:00 AM EST by telephone and verified that I am speaking with the correct person using two identifiers.   I discussed the limitations, risks, security and privacy concerns of performing an evaluation and management service by telephone and the availability of in person appointments. I also discussed with the patient that there may be a patient responsible charge related to this service. The patient expressed understanding and agreed to proceed.   History of Present Illness:  03/10/21 ALL: Rebecca Rose is a 66 y.o. female here today for follow up for neuropathy. She continues gabapentin 600mg  in the evening and 600mg  at bedtime. She also takes nortriptyline 20mg  at bedtime. She feels that burning in calf is mostly stable. She has "bad nights" from time to time. Legs feel like they are burning and very sensitive to touch. She also describes a cramping sensation with electrical pains that shoot through legs when she sits still. Improved with movement. She uses roll on lidocaine that provides temporary relief. She has noticed more numbness in her toes over the past could of months. She has a history of B12 def. She takes B12 1020mcg daily. She is stay in [place] with her daughter who recently had a baby. She comes home to [place] once a month. No recent concerns of TIA. She is followed regularly by PCP. BP well managed. Cholesterol well managed on rosuvastatin.   06/07/20 ALL (office):  She returns for follow up for idiopathic neuropathy. We increased gabapentin to 300/300/600 and continued nortriptyline 20mg  daily at last visit in 01/2020. She reports that she could not tolerate side effects. She has continued 600mg  at 6pm and 600mg  at bedtime. Neuropathy pain is unchanged. Pain waxes and wanes.    She was diagnosed with  Covid in 01/2020. She had pneumonia in March 2022. After recovering, she has noted a sharp pain that starts in her calf and radiates upward to the back of her thigh, turns to a throbbing pain by end of the day. Worse when sitting. Not burning or tingling. Feels different from neuropathy pain. Her back has been hurting more than normal over the same period of time. She feels that she has more fibromyalgia pain following Covid. She hurts all over. She has a new grandson on the way and wants to go to [place] to help care for him. She denies weakness. No falls. No changes in gait. She can not take Nsaids due to history of gastric ulcers. She is followed by Dr Nelva Bush with [facility].    Observations/Objective:  Generalized: Well developed, in no acute distress  Mentation: Alert oriented to time, place, history taking. Follows all commands speech and language fluent  Assessment and Plan:  66 y.o. year old female  has a past medical history of Cataracts, bilateral, Fibroleiomyoma, Fibromyalgia, Glaucoma, History of stomach ulcers, Neuropathy of leg, Pneumonia, and Vision abnormalities. here with    ICD-10-CM   1. Idiopathic peripheral neuropathy  G60.9 gabapentin (NEURONTIN) 300 MG capsule    2. History of transient ischemic attack (TIA)  Z86.73     3. B12 deficiency  E53.8     4. Numbness of toes  R20.0      Rebecca Rose reports that, overall, neuropathy is fairly stable. She has had more numbness in the toes and feels that she has more cramping of calves, recently. She  has recently moved to [place] part time to help care for her grandchild. I have advised that she keep a close eye on symptoms. We can recheck B12 levels to make sure she is absorbing B12 supplements. Could repeat NCS, however, I am not certain it will lend to any changes in care plan. She will continue gabapentin 600mg  twice daily and nortriptyline 20mg  daily. Lidocaine or capsaicin cream can be helpful OTC. May consider trail of compounded neuropathy  cream if she wishes. Could have overlapping RLS symptoms. She was treated for RLS in the past but reports it was not helpful, unclear which meds she was prescribed. No recent TIA symptoms. BP and lipids well managed. She follows closely with PCP for thyroid follow up. Stroke precautions advised. Healthy lifestyle habits encouraged. She will follow up with me in 6-12 months pending worsening symptoms. She verbalizes understanding and agreement with this plan.    No orders of the defined types were placed in this encounter.   Meds ordered this encounter  Medications   gabapentin (NEURONTIN) 300 MG capsule    Sig: Take 2 capsules (600 mg total) by mouth 2 (two) times daily. TAKE 1 CAPSULE THREE TIMES A DAY    Dispense:  360 capsule    Refill:  3    Order Specific Question:   Supervising Provider    Answer:   Melvenia Beam [[id number]]   nortriptyline (PAMELOR) 10 MG capsule    Sig: Take 2 capsules (20 mg total) by mouth at bedtime.    Dispense:  180 capsule    Refill:  3    Order Specific Question:   Supervising Provider    Answer:   Melvenia Beam [id number]     Follow Up Instructions:  I discussed the assessment and treatment plan with the patient. The patient was provided an opportunity to ask questions and all were answered. The patient agreed with the plan and demonstrated an understanding of the instructions.   The patient was advised to call back or seek an in-person evaluation if the symptoms worsen or if the condition fails to improve as anticipated.  I provided 30 minutes of non-face-to-face time during this encounter. Patient located at their place of residence during [place] visit. Provider is in the office.    Debbora Presto, NP

## 2021-03-10 ENCOUNTER — Encounter: Payer: Self-pay | Admitting: Family Medicine

## 2021-03-10 ENCOUNTER — Telehealth: Payer: Medicare Other | Admitting: Family Medicine

## 2021-03-10 DIAGNOSIS — R200 Anesthesia of skin: Secondary | ICD-10-CM

## 2021-03-10 DIAGNOSIS — G609 Hereditary and idiopathic neuropathy, unspecified: Secondary | ICD-10-CM

## 2021-03-10 DIAGNOSIS — E538 Deficiency of other specified B group vitamins: Secondary | ICD-10-CM | POA: Diagnosis not present

## 2021-03-10 DIAGNOSIS — Z8673 Personal history of transient ischemic attack (TIA), and cerebral infarction without residual deficits: Secondary | ICD-10-CM | POA: Diagnosis not present

## 2021-03-10 MED ORDER — NORTRIPTYLINE HCL 10 MG PO CAPS: 20.0000 mg | ORAL_CAPSULE | Freq: Every day | ORAL | 3 refills | Status: DC

## 2021-03-10 MED ORDER — GABAPENTIN 300 MG PO CAPS: 600.0000 mg | ORAL_CAPSULE | Freq: Two times a day (BID) | ORAL | 3 refills | Status: DC

## 2021-10-25 ENCOUNTER — Other Ambulatory Visit (HOSPITAL_BASED_OUTPATIENT_CLINIC_OR_DEPARTMENT_OTHER): Payer: Self-pay | Admitting: Family Medicine

## 2021-10-25 ENCOUNTER — Telehealth (HOSPITAL_BASED_OUTPATIENT_CLINIC_OR_DEPARTMENT_OTHER): Payer: Self-pay

## 2021-10-25 DIAGNOSIS — Z13820 Encounter for screening for osteoporosis: Secondary | ICD-10-CM

## 2022-01-04 ENCOUNTER — Encounter: Payer: Self-pay | Admitting: Family Medicine

## 2022-01-11 ENCOUNTER — Other Ambulatory Visit (HOSPITAL_BASED_OUTPATIENT_CLINIC_OR_DEPARTMENT_OTHER): Payer: Medicare Other

## 2022-01-17 ENCOUNTER — Encounter: Payer: Self-pay | Admitting: Family Medicine

## 2022-02-15 ENCOUNTER — Ambulatory Visit (HOSPITAL_BASED_OUTPATIENT_CLINIC_OR_DEPARTMENT_OTHER)
Admission: RE | Admit: 2022-02-15 | Discharge: 2022-02-15 | Disposition: A | Discharge status: Home / Self Care | Payer: Medicare Other | Source: Ambulatory Visit | Attending: Family Medicine | Admitting: Family Medicine

## 2022-02-15 DIAGNOSIS — M810 Age-related osteoporosis without current pathological fracture: Secondary | ICD-10-CM | POA: Insufficient documentation

## 2022-02-15 DIAGNOSIS — E039 Hypothyroidism, unspecified: Secondary | ICD-10-CM | POA: Diagnosis not present

## 2022-02-15 DIAGNOSIS — Z13820 Encounter for screening for osteoporosis: Secondary | ICD-10-CM | POA: Diagnosis present

## 2022-02-15 DIAGNOSIS — Z780 Asymptomatic menopausal state: Secondary | ICD-10-CM | POA: Insufficient documentation

## 2022-03-01 ENCOUNTER — Encounter: Payer: Self-pay | Admitting: Family Medicine

## 2022-03-29 ENCOUNTER — Other Ambulatory Visit: Payer: Self-pay | Admitting: *Deleted

## 2022-03-29 MED ORDER — NORTRIPTYLINE HCL 10 MG PO CAPS: 20.0000 mg | ORAL_CAPSULE | Freq: Every day | ORAL | 0 refills | Status: DC

## 2022-04-03 ENCOUNTER — Other Ambulatory Visit: Payer: Self-pay | Admitting: *Deleted

## 2022-04-03 DIAGNOSIS — G609 Hereditary and idiopathic neuropathy, unspecified: Secondary | ICD-10-CM

## 2022-04-03 MED ORDER — GABAPENTIN 300 MG PO CAPS: 600.0000 mg | ORAL_CAPSULE | Freq: Two times a day (BID) | ORAL | 0 refills | Status: DC

## 2022-04-03 NOTE — Telephone Encounter (Signed)
Patient last seen on 03/10/2021 No follow up scheduled.

## 2022-04-17 NOTE — Patient Instructions (Addendum)
Below is our plan:  We will continue nortriptyline 20mg  at bedtime. Continue gabapentin 600mg  BID. We will add oxcarbazepine 150mg  at bedtime for 1 week then increase to 150mg  twice daily. Please let me know if you have any trouble tolerating it. I would like for you to have your sodium checked in the next 4 weeks. Let me know if there is any concerns with your sodium level when checked.   Ask your pain management provider about Qutenza patches for neuropathy.  Please make sure you are staying well hydrated. I recommend 50-60 ounces daily. Well balanced diet and regular exercise encouraged. Consistent sleep schedule with 6-8 hours recommended.   Please continue follow up with care team as directed.   Follow up with me in 6 months   You may receive a survey regarding today's visit. I encourage you to leave honest feed back as I do use this information to improve patient care. Thank you for seeing me today!

## 2022-04-17 NOTE — Progress Notes (Signed)
Chief Complaint  Patient presents with   Room 1    Pt is here Alone. Pt states that she feels that her neuropathy has gotten worse. Pt states that she is having burning and numbness in her legs that are spreading to her feet. Pt states that she is having some numbness in her hands.      HISTORY OF PRESENT ILLNESS:  04/23/22 ALL:  Marylon returns for follow up for neuropathy. She continues gabapentin 600mg  BID and nortriptyline 20mg  at bedtime. She reports worsening of neuropathy pain since last visit. She is having more pain from just above knees to feet. Pain described as burning. Waxes and wanes. Pain is worse at night. She reports first dose of gabapentin 600mg  taken at 6pm makes her feel really loopy for about an hour. She takes second dose of gabapentin 600mg  with nortriptyline 20mg  at bedtime around 9-10pm. She is usually able to go right to sleep but has had some difficulty with getting to sleep on nights when pain is bad. She has also noted more sharp pain of the right anterior thigh and lateral calf when driving longer distances. Pain gets better when she moves around. She is followed by Dr Nelva Bush for chronic back pain post surgery. She is followed by PCP regularly. Recently diagnosed with Hashimoto. She continues B12 oral supplements.   03/10/21 ALL (Mychart): Latima Common is a 67 y.o. female here today for follow up for neuropathy. She continues gabapentin 600mg  in the evening and 600mg  at bedtime. She also takes nortriptyline 20mg  at bedtime. She feels that burning in calf is mostly stable. She has "bad nights" from time to time. Legs feel like they are burning and very sensitive to touch. She also describes a cramping sensation with electrical pains that shoot through legs when she sits still. Improved with movement. She uses roll on lidocaine that provides temporary relief. She has noticed more numbness in her toes over the past could of months. She has a history of B12 def. She  takes B12 1052mcg daily. She is stay in [place] with her daughter who recently had a baby. She comes home to [place] once a month. No recent concerns of TIA. She is followed regularly by PCP. BP well managed. Cholesterol well managed on rosuvastatin.   06/07/2020 ALL: She returns for follow up for idiopathic neuropathy. We increased gabapentin to 300/300/600 and continued nortriptyline 20mg  daily at last visit in 01/2020. She reports that she could not tolerate side effects. She has continued 600mg  at 6pm and 600mg  at bedtime. Neuropathy pain is unchanged. Pain waxes and wanes.   She was diagnosed with Covid in 01/2020. She had pneumonia in March 2022. After recovering, she has noted a sharp pain that starts in her calf and radiates upward to the back of her thigh, turns to a throbbing pain by end of the day. Worse when sitting. Not burning or tingling. Feels different from neuropathy pain. Her back has been hurting more than normal over the same period of time. She feels that she has more fibromyalgia pain following Covid. She hurts all over. She has a new grandson on the way and wants to go to [place] to help care for him. She denies weakness. No falls. No changes in gait. She can not take Nsaids due to history of gastric ulcers. She is followed by Dr Nelva Bush with [facility].    01/13/2020 ALL:  Rogene Kelly is a 67 y.o. female here today for follow up for neuropathy.  She continues gabapentin 600mg  at 6pm and 300mg  at 9pm. She also continues nortriptyline 20mg  for neuropathic pain and insomnia. She reports that burning sensation is spreading. She is now experiencing electrical/burning sensation of bilateral calves that spreads just above both knees and to both ankles. Over the past few months, she started noticing pins and needles in both feet.   She continues to follow up with pain management. She has tried multiple procedures and medications to help manage low back pain. She has a significant history of  degenerative changes. She has failed two lumbar fusions. She has failed ESI, Ketamine and multiple other pain medications. She is now taking tramadol 2 times daily (prescribed for up to 4 times a day). Thyroid has been well managed. She continues rosuvastatin. No recent TIA symptoms.    HISTORY (copied from previous note)  Orletta Solimine is a 67 y.o. female here today for follow up for idiopathic peripheral neuropathy. Numbness and tingling in feet continue but she is most bothered by leg pain. Pain is worsening over the past few months. Pain is sharp and feels like electricity in bilateral legs. Pain is all day but worse at night. She had a NCS in [place] and told that she did not have RLS. Has history of low back pain but does not feel this is related. She is failed Cymbalta and Lyrica in the past. She has tried RLS medications (unsure of the name) that did not help. She has fibromyalgia but feel this is under control at this time. She is taking gabapentin 600mg  at 6pm and 300mg  at bedtime (9-10pm). She is also taking nortriptyline 10mg  at bedtime that has helped some. Alpha lipoic acid did not help.  She is managed by Dr Nelva Bush for pain management using injection therapy every 6 months. She takes Tramadol 1-2 tablets daily.    She continues rosuvastatin and asa 81mg  for stroke prevention. BP is normal.    HISTORY: (copied from my note on 04/01/2018)   Donene Lasko is a 67 y.o. female here today for follow up for idiopathic peripheral neuropathy. She continues gabapentin 600mg  in the evenings. She has tried to increase dose but suffers with daytime sleepiness. She was started on nortriptyline 10mg  at bedtime in 08/2017. She does feel that this helps some. It also helps her sleep but she continues to wake up in the middle of the night. 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 She has her adult son living with her who suffers from depression with suicidal ideations. Her husband  is retired.   She had a "really bad headache" about a month ago that lasted for about 5 days. She reports that she had blurred vision and significant pain in her neck with headache. She has a history of migraines but states that she has not had one in years. She reports headache finally resolved after taking Tylenol. She denies vision loss, confusion, trouble speaking or weakness with headache. She does feel that her feeling is off on the bottoms of her feet and occasionally she will miss a step. She denies falls or trouble walking. She has participated in PT in the past but dies not feel it helps. She states that this headache event was not similar to her previous TIA.     She is continuing Crestor and asa for stroke prevention. Recent labs with PCP were perfect with the exception of A1C of 5.7. She has gained about 8 pounds in the past 2-3  months.    HISTORY: (copied from [employer] note on 09/04/2017) UPDATE 7/31/2019CM Ms.Sohm, 67 year old female returns for follow-up with history of idiopathic peripheral neuropathy.  She continues to complain with burning electric-like pain in both lower extremities.  She has failed Cymbalta and Lyrica in the past.  She is currently on gabapentin 300 mg 1 morning and 2 (2) hours before bedtime.  Unable to increase dose due to daytime drowsiness in the morning.  Her neuropathy pain wakes her up at night and she has trouble sleeping. Past medical history osteoarthritis, fibromyalgia,posterior vitreous detachment both eyes, migraines and TIA x2.  She is currently on aspirin and Crestor for secondary stroke prevention.  She returns for reevaluation.   09/04/16 AA Michae Plymire is a 67 y.o. female here as a referral from Dr. No ref. provider found for blurred vision and vision loss of left eye. Past medical history osteoarthritis, fibromyalgia,posterior vitreous detachment both eyes, migraines. Patient reports an episode of painless, complete loss of vision left  eye in January which lasted about 3 minutes and self resolved. Has not recurred. Ophthalmologist does not feel this episode is explained by eye findings. Suggested evaluation for TIA or stroke.Happened in January, she was walking to the cafeteria and all of a sudden some pulled a curtain over her left eye, it was completely black, couldn;t see light or shaows. Lasted a minute and went away. She continued to have blurred vision and headaches. The blurred vision is betetr, headaches improved. The left eye stayed blurred for 3 weeks and daily headaches. Still with headaches occasionally. No pain on eye movement. The headache is an ache on the right. She has a history of migraines last one 22 years ago. She is not on a baby aspirin. No other focal neurologic deficits, associated symptoms, inciting events or modifiable factors. Echo showed grade 1 diastolic dysfunction and she was asked to follow up with primary care, but no thrombus or causes for TIA seen on echocardiogram.   REVIEW OF SYSTEMS: Out of a complete 14 system review of symptoms, the patient complains only of the following symptoms, neuropathic pain, low back pain, right leg pain and all other reviewed systems are negative.   ALLERGIES: Allergies  Allergen Reactions   Nsaids Other (See Comments)    Stomach ulcers   Seasonal Ic  [Cholestatin]      HOME MEDICATIONS: Outpatient Medications Prior to Visit  Medication Sig Dispense Refill   acetaminophen (TYLENOL) 650 MG CR tablet Take 650 mg by mouth 2 (two) times daily.     Ascorbic Acid (VITAMIN C) 1000 MG tablet Take 1,000 mg by mouth daily.     aspirin EC 81 MG tablet Take 81 mg by mouth daily.     Calcium Carbonate-Vit D-Min (CALCIUM 1200 PO) Take 1,200 mg by mouth daily.     Cholecalciferol (VITAMIN D3) 5000 units CAPS Take 5,000 Units by mouth daily.     Coenzyme Q10 (COQ-10) 200 MG CAPS Take 200 mg by mouth daily. (Patient taking differently: Take 200 mg by mouth 2 (two) times  daily.)  0   famotidine (PEPCID) 10 MG tablet Take 10 mg by mouth 2 (two) times daily as needed for heartburn or indigestion.      fexofenadine (ALLEGRA) 180 MG tablet Take 180 mg by mouth daily.     fluticasone-salmeterol (WIXELA INHUB) 500-50 MCG/ACT AEPB Inhale 1 puff into the lungs in the morning and at bedtime.     gabapentin (NEURONTIN) 300 MG capsule Take 2  capsules (600 mg total) by mouth 2 (two) times daily. 600mg  in the evening and 600mg  at bedtime 360 capsule 0   levOCARNitine (L-CARNITINE PO) Take 1,000 mg by mouth.     levothyroxine (SYNTHROID, LEVOTHROID) 50 MCG tablet Take 50 mcg by mouth daily before breakfast.     nortriptyline (PAMELOR) 10 MG capsule Take 2 capsules (20 mg total) by mouth at bedtime. 180 capsule 0   rosuvastatin (CRESTOR) 10 MG tablet Take 10 mg by mouth every evening.      tiotropium (SPIRIVA) 18 MCG inhalation capsule Place 18 mcg into inhaler and inhale daily.     traMADol (ULTRAM) 50 MG tablet Take 50 mg by mouth. Takes 2-3 per day prn.  (2x max usually)     vitamin B-12 (CYANOCOBALAMIN) 1000 MCG tablet Take 1,000 mcg by mouth daily.      zinc gluconate 50 MG tablet Take 50 mg by mouth daily.     ALPRAZolam (XANAX) 0.5 MG tablet Take 0.5 tablets by mouth 2 (two) times daily. (Patient not taking: Reported on 04/23/2022)     No facility-administered medications prior to visit.     PAST MEDICAL HISTORY: Past Medical History:  Diagnosis Date   Cataracts, bilateral    Fibroleiomyoma    Fibromyalgia    Glaucoma    History of stomach ulcers    Neuropathy of leg    Pneumonia    11/21   Vision abnormalities      PAST SURGICAL HISTORY: Past Surgical History:  Procedure Laterality Date   bone spur toe left foot     carpal tunnel both hands  2005-2006   fusion lower back  11/2008   fusion lower back  10/1998   fusion to lower back  11/2010   knee arthoscopy  1995/2000   five on right leg   ulkna release right  2006     FAMILY HISTORY: Family  History  Problem Relation Age of Onset   Transient ischemic attack Father      SOCIAL HISTORY: Social History   Socioeconomic History   Marital status: Married    Spouse name: Not on file   Number of children: Not on file   Years of education: Not on file   Highest education level: Not on file  Occupational History   Occupation: Art gallery manager  Tobacco Use   Smoking status: Never   Smokeless tobacco: Never  Vaping Use   Vaping Use: Never used  Substance and Sexual Activity   Alcohol use: No   Drug use: No   Sexual activity: Not on file  Other Topics Concern   Not on file  Social History Narrative   Lives at home w/ her husband   Right-caffeine   Caffeine: 1 large cup of coffee per day   Social Determinants of Health   Financial Resource Strain: Not on file  Food Insecurity: Not on file  Transportation Needs: Not on file  Physical Activity: Not on file  Stress: Not on file  Social Connections: Not on file  Intimate Partner Violence: Not on file      PHYSICAL EXAM  Vitals:   04/23/22 0744  BP: (!) 131/91  Pulse: 83  Weight: 131 lb 8 oz (59.6 kg)  Height: 5\' 3"  (1.6 m)    Body mass index is 23.29 kg/m.   Generalized: Well developed, in no acute distress  Cardiology: normal rate and rhythm, no murmur auscultated  Respiratory: clear to auscultation bilaterally    Neurological examination  Mentation: Alert oriented to time, place, history taking. Follows all commands speech and language fluent Cranial nerve II-XII: Pupils were equal round reactive to light. Extraocular movements were full, visual field were full on confrontational test. Facial sensation and strength were normal. Head turning and shoulder shrug  were normal and symmetric. Motor: The motor testing reveals 5 over 5 strength of all 4 extremities. Good symmetric motor tone is noted throughout.  Sensory: Sensory testing is intact to soft touch and pinprick on all 4 extremities with exception of  slight decreased sensation of medial portion of right foot. No evidence of extinction is noted.  Coordination: Cerebellar testing reveals good finger-nose-finger and heel-to-shin bilaterally.  Gait and station: Gait is normal.      DIAGNOSTIC DATA (LABS, IMAGING, TESTING) - I reviewed patient records, labs, notes, testing and imaging myself where available.  Lab Results  Component Value Date   WBC 5.6 09/21/2016   HGB 13.6 10/04/2016   HCT 40.0 10/04/2016   MCV 93.2 09/21/2016   PLT 231 09/21/2016      Component Value Date/Time   NA 141 10/04/2016 1207   NA 144 03/28/2016 0843   K 4.1 10/04/2016 1207   CL 104 10/04/2016 1207   CO2 22 09/21/2016 0914   GLUCOSE 83 10/04/2016 1207   BUN 11 10/04/2016 1207   BUN 10 03/28/2016 0843   CREATININE 0.70 10/04/2016 1207   CALCIUM 9.3 09/21/2016 0914   PROT 6.9 09/21/2016 0914   PROT 7.8 09/04/2016 1502   ALBUMIN 4.2 09/21/2016 0914   AST 32 09/21/2016 0914   ALT 17 09/21/2016 0914   ALKPHOS 62 09/21/2016 0914   BILITOT 0.6 09/21/2016 0914   GFRNONAA >60 09/21/2016 0914   GFRAA >60 09/21/2016 0914   Lab Results  Component Value Date   CHOL 198 09/21/2016   HDL 48 09/21/2016   LDLCALC 92 09/21/2016   TRIG 292 (H) 09/21/2016   CHOLHDL 4.1 09/21/2016   Lab Results  Component Value Date   HGBA1C 5.5 09/21/2016   Lab Results  Component Value Date   VITAMINB12 >2000 (H) 09/04/2016   Lab Results  Component Value Date   TSH 10.130 (H) 09/04/2016      ASSESSMENT AND PLAN  67 y.o. year old female  has a past medical history of Cataracts, bilateral, Fibroleiomyoma, Fibromyalgia, Glaucoma, History of stomach ulcers, Neuropathy of leg, Pneumonia, and Vision abnormalities. here with   Idiopathic peripheral neuropathy  B12 deficiency   Tevis has had progressive pain over the past year. We will continue gabapentin 600mg  BID and nortriptyline 20mg  daily. I will add oxcarbazepine 150mg  BID. She was advised to start 150mg  at  bedtime for 1 week then increase to BID dosing. Could increase to 300mg  BID in future if tolerated wel. Would like to wean gabapentin in the future if able. We will check chem and B12. She will continue oral B12 for now. She will continue close follow up with Dr Nelva Bush. Could consider Qutenza patches for neuropathy management if she wishes. Will be happy to place referral if Dr Nelva Bush does not offer. Healthy lifestyle habits encouraged. She will continue close follow-up with primary care and care team. Will monitor sodium.  She will follow-up with me in 6 months, sooner if needed.  She verbalizes understanding and agreement with this plan.  Meds ordered this encounter  Medications   OXcarbazepine (TRILEPTAL) 150 MG tablet    Sig: Take 1 tablet (150 mg total) by mouth 2 (two) times daily.  Dispense:  180 tablet    Refill:  1    Order Specific Question:   Supervising Provider    Answer:   Melvenia Beam [id number]    Orders Placed This Encounter  Procedures   CMP   B12 and Folate Panel     I spent 30 minutes of face-to-face and non-face-to-face time with patient.  This included previsit chart review, lab review, study review, order entry, electronic health record documentation, patient education.   Debbora Presto, MSN, FNP-C 04/23/2022, 7:47 AM  [facility] [address], [place], [place] [address] [phone]

## 2022-04-23 ENCOUNTER — Encounter: Payer: Self-pay | Admitting: Family Medicine

## 2022-04-23 ENCOUNTER — Ambulatory Visit: Payer: Medicare Other | Admitting: Family Medicine

## 2022-04-23 VITALS — BP 131/91 | HR 83 | Ht 63.0 in | Wt 131.5 lb

## 2022-04-23 DIAGNOSIS — E538 Deficiency of other specified B group vitamins: Secondary | ICD-10-CM | POA: Diagnosis not present

## 2022-04-23 DIAGNOSIS — G609 Hereditary and idiopathic neuropathy, unspecified: Secondary | ICD-10-CM | POA: Diagnosis not present

## 2022-04-23 MED ORDER — OXCARBAZEPINE 150 MG PO TABS: 150.0000 mg | ORAL_TABLET | Freq: Two times a day (BID) | ORAL | 1 refills | Status: DC

## 2022-04-24 LAB — COMPREHENSIVE METABOLIC PANEL
ALT: 22 IU/L (ref 0–32)
AST: 25 IU/L (ref 0–40)
Albumin/Globulin Ratio: 2.1 (ref 1.2–2.2)
Albumin: 4.8 g/dL (ref 3.9–4.9)
Alkaline Phosphatase: 98 IU/L (ref 44–121)
BUN/Creatinine Ratio: 16 (ref 12–28)
BUN: 11 mg/dL (ref 8–27)
Bilirubin Total: 0.4 mg/dL (ref 0.0–1.2)
CO2: 25 mmol/L (ref 20–29)
Calcium: 10.3 mg/dL (ref 8.7–10.3)
Chloride: 102 mmol/L (ref 96–106)
Creatinine, Ser: 0.68 mg/dL (ref 0.57–1.00)
Globulin, Total: 2.3 g/dL (ref 1.5–4.5)
Glucose: 84 mg/dL (ref 70–99)
Potassium: 4.4 mmol/L (ref 3.5–5.2)
Sodium: 143 mmol/L (ref 134–144)
Total Protein: 7.1 g/dL (ref 6.0–8.5)
eGFR: 95 mL/min/{1.73_m2} (ref >59)

## 2022-04-24 LAB — B12 AND FOLATE PANEL
Folate: 15.9 ng/mL (ref >3.0)
Vitamin B-12: >2000 pg/mL — ABNORMAL HIGH (ref 232–1245)

## 2022-05-20 ENCOUNTER — Encounter: Payer: Self-pay | Admitting: Family Medicine

## 2022-06-19 ENCOUNTER — Encounter: Payer: Self-pay | Admitting: Family Medicine

## 2022-06-27 ENCOUNTER — Other Ambulatory Visit: Payer: Self-pay | Admitting: *Deleted

## 2022-06-27 MED ORDER — NORTRIPTYLINE HCL 10 MG PO CAPS: 20.0000 mg | ORAL_CAPSULE | Freq: Every day | ORAL | 0 refills | Status: DC

## 2022-06-27 NOTE — Telephone Encounter (Signed)
Pt last seen 04/23/22 per note "We will continue gabapentin 600mg  BID and nortriptyline 20mg  daily. "  No 6 month follow up scheduled

## 2022-07-27 ENCOUNTER — Encounter: Payer: Self-pay | Admitting: Family Medicine

## 2022-07-30 ENCOUNTER — Other Ambulatory Visit: Payer: Self-pay

## 2022-07-30 DIAGNOSIS — G609 Hereditary and idiopathic neuropathy, unspecified: Secondary | ICD-10-CM

## 2022-07-30 MED ORDER — GABAPENTIN 300 MG PO CAPS: 600.0000 mg | ORAL_CAPSULE | Freq: Two times a day (BID) | ORAL | 0 refills | Status: DC

## 2022-07-30 MED ORDER — OXCARBAZEPINE 150 MG PO TABS: 150.0000 mg | ORAL_TABLET | Freq: Two times a day (BID) | ORAL | 1 refills | Status: DC

## 2022-09-26 ENCOUNTER — Other Ambulatory Visit: Payer: Self-pay

## 2022-09-26 MED ORDER — NORTRIPTYLINE HCL 10 MG PO CAPS: 20.0000 mg | ORAL_CAPSULE | Freq: Every day | ORAL | 3 refills | Status: DC

## 2022-11-19 ENCOUNTER — Other Ambulatory Visit: Payer: Self-pay | Admitting: Family Medicine

## 2022-11-19 DIAGNOSIS — G609 Hereditary and idiopathic neuropathy, unspecified: Secondary | ICD-10-CM

## 2022-11-20 MED ORDER — GABAPENTIN 300 MG PO CAPS: 600.0000 mg | ORAL_CAPSULE | Freq: Two times a day (BID) | ORAL | 0 refills | Status: DC

## 2022-11-20 NOTE — Telephone Encounter (Signed)
Last seen on 04/23/22 No 6 month follow up scheduled

## 2022-12-18 NOTE — Patient Instructions (Signed)
Below is our plan:  We will continue gabapentin 600mg  at 6p and bedtime. Continue nortriptyline 20mg  daily at bedtime. We will add duloxetine 30mg  daily. Reduce dose of oxcarb to 150mg  daily for 1 week then discontinue.   Come in next week for a sodium check.   Please make sure you are staying well hydrated. I recommend 50-60 ounces daily. Well balanced diet and regular exercise encouraged. Consistent sleep schedule with 6-8 hours recommended.   Please continue follow up with care team as directed.   Follow up with me in 6 months   You may receive a survey regarding today's visit. I encourage you to leave honest feed back as I do use this information to improve patient care. Thank you for seeing me today!

## 2022-12-18 NOTE — Progress Notes (Unsigned)
No chief complaint on file.    HISTORY OF PRESENT ILLNESS:  12/18/22 ALL:  Rebecca Rose returns for follow up for neuropathy. She was last seen 04/2022. We added oxcarbazepine and continued gabapentin and nortriptyline. Since,   04/23/2022 ALL: Rebecca Rose returns for follow up for neuropathy. She continues gabapentin 600mg  BID and nortriptyline 20mg  at bedtime. She reports worsening of neuropathy pain since last visit. She is having more pain from just above knees to feet. Pain described as burning. Waxes and wanes. Pain is worse at night. She reports first dose of gabapentin 600mg  taken at 6pm makes her feel really loopy for about an hour. She takes second dose of gabapentin 600mg  with nortriptyline 20mg  at bedtime around 9-10pm. She is usually able to go right to sleep but has had some difficulty with getting to sleep on nights when pain is bad. She has also noted more sharp pain of the right anterior thigh and lateral calf when driving longer distances. Pain gets better when she moves around. She is followed by Dr Ethelene Hal for chronic back pain post surgery. She is followed by PCP regularly. Recently diagnosed with Hashimoto. She continues B12 oral supplements.   03/10/21 ALL (Mychart): Rebecca Rose is a 67 y.o. female here today for follow up for neuropathy. She continues gabapentin 600mg  in the evening and 600mg  at bedtime. She also takes nortriptyline 20mg  at bedtime. She feels that burning in calf is mostly stable. She has "bad nights" from time to time. Legs feel like they are burning and very sensitive to touch. She also describes a cramping sensation with electrical pains that shoot through legs when she sits still. Improved with movement. She uses roll on lidocaine that provides temporary relief. She has noticed more numbness in her toes over the past could of months. She has a history of B12 def. She takes B12 daily. She is stay in [place] with her daughter who recently had a baby. She comes home  to [place] once a month. No recent concerns of TIA. She is followed regularly by PCP. BP well managed. Cholesterol well managed on rosuvastatin.   06/07/2020 ALL: She returns for follow up for idiopathic neuropathy. We increased gabapentin to 300/300/600 and continued nortriptyline 20mg  daily at last visit in 01/2020. She reports that she could not tolerate side effects. She has continued 600mg  at 6pm and 600mg  at bedtime. Neuropathy pain is unchanged. Pain waxes and wanes.   She was diagnosed with Covid in 01/2020. She had pneumonia in March 2022. After recovering, she has noted a sharp pain that starts in her calf and radiates upward to the back of her thigh, turns to a throbbing pain by end of the day. Worse when sitting. Not burning or tingling. Feels different from neuropathy pain. Her back has been hurting more than normal over the same period of time. She feels that she has more fibromyalgia pain following Covid. She hurts all over. She has a new grandson on the way and wants to go to [place] to help care for him. She denies weakness. No falls. No changes in gait. She can not take Nsaids due to history of gastric ulcers. She is followed by Dr Ethelene Hal with [facility].    01/13/2020 ALL:  Rebecca Rose is a 67 y.o. female here today for follow up for neuropathy. She continues gabapentin 600mg  at 6pm and 300mg  at 9pm. She also continues nortriptyline 20mg  for neuropathic pain and insomnia. She reports that burning sensation is spreading. She is  now experiencing electrical/burning sensation of bilateral calves that spreads just above both knees and to both ankles. Over the past few months, she started noticing pins and needles in both feet.   She continues to follow up with pain management. She has tried multiple procedures and medications to help manage low back pain. She has a significant history of degenerative changes. She has failed two lumbar fusions. She has failed ESI, Ketamine and multiple other pain  medications. She is now taking tramadol 2 times daily (prescribed for up to 4 times a day). Thyroid has been well managed. She continues rosuvastatin. No recent TIA symptoms.    HISTORY (copied from previous note)  Rebecca Rose is a 67 y.o. female here today for follow up for idiopathic peripheral neuropathy. Numbness and tingling in feet continue but she is most bothered by leg pain. Pain is worsening over the past few months. Pain is sharp and feels like electricity in bilateral legs. Pain is all day but worse at night. She had a NCS in [place] and told that she did not have RLS. Has history of low back pain but does not feel this is related. She is failed Cymbalta and Lyrica in the past. She has tried RLS medications (unsure of the name) that did not help. She has fibromyalgia but feel this is under control at this time. She is taking gabapentin 600mg  at 6pm and 300mg  at bedtime (9-10pm). She is also taking nortriptyline 10mg  at bedtime that has helped some. Alpha lipoic acid did not help.  She is managed by Dr Ethelene Hal for pain management using injection therapy every 6 months. She takes Tramadol 1-2 tablets daily.    She continues rosuvastatin and asa 81mg  for stroke prevention. BP is normal.    HISTORY: (copied from my note on 04/01/2018)   Rebecca Rose is a 67 y.o. female here today for follow up for idiopathic peripheral neuropathy. She continues gabapentin 600mg  in the evenings. She has tried to increase dose but suffers with daytime sleepiness. She was started on nortriptyline 10mg  at bedtime in 08/2017. She does feel that this helps some. It also helps her sleep but she continues to wake up in the middle of the night. 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 She has her adult son living with her who suffers from depression with suicidal ideations. Her husband is retired.   She had a "really bad headache" about a month ago that lasted for about 5 days. She reports  that she had blurred vision and significant pain in her neck with headache. She has a history of migraines but states that she has not had one in years. She reports headache finally resolved after taking Tylenol. She denies vision loss, confusion, trouble speaking or weakness with headache. She does feel that her feeling is off on the bottoms of her feet and occasionally she will miss a step. She denies falls or trouble walking. She has participated in PT in the past but dies not feel it helps. She states that this headache event was not similar to her previous TIA.     She is continuing Crestor and asa for stroke prevention. Recent labs with PCP were perfect with the exception of A1C of 5.7. She has gained about 8 pounds in the past 2-3 months.    HISTORY: (copied from [employer] note on 09/04/2017) UPDATE 7/31/2019CM Rebecca Rose, 67 year old female returns for follow-up with history of idiopathic peripheral neuropathy.  She continues  to complain with burning electric-like pain in both lower extremities.  She has failed Cymbalta and Lyrica in the past.  She is currently on gabapentin 300 mg 1 morning and 2 (2) hours before bedtime.  Unable to increase dose due to daytime drowsiness in the morning.  Her neuropathy pain wakes her up at night and she has trouble sleeping. Past medical history osteoarthritis, fibromyalgia,posterior vitreous detachment both eyes, migraines and TIA x2.  She is currently on aspirin and Crestor for secondary stroke prevention.  She returns for reevaluation.   09/04/16 Rebecca Rose is a 67 y.o. female here as a referral from Dr. No ref. provider found for blurred vision and vision loss of left eye. Past medical history osteoarthritis, fibromyalgia,posterior vitreous detachment both eyes, migraines. Patient reports an episode of painless, complete loss of vision left eye in January which lasted about 3 minutes and self resolved. Has not recurred. Ophthalmologist does not  feel this episode is explained by eye findings. Suggested evaluation for TIA or stroke.Happened in January, she was walking to the cafeteria and all of a sudden some pulled a curtain over her left eye, it was completely black, couldn;t see light or shaows. Lasted a minute and went away. She continued to have blurred vision and headaches. The blurred vision is betetr, headaches improved. The left eye stayed blurred for 3 weeks and daily headaches. Still with headaches occasionally. No pain on eye movement. The headache is an ache on the right. She has a history of migraines last one 22 years ago. She is not on a baby aspirin. No other focal neurologic deficits, associated symptoms, inciting events or modifiable factors. Echo showed grade 1 diastolic dysfunction and she was asked to follow up with primary care, but no thrombus or causes for TIA seen on echocardiogram.   REVIEW OF SYSTEMS: Out of a complete 14 system review of symptoms, the patient complains only of the following symptoms, neuropathic pain, low back pain, right leg pain and all other reviewed systems are negative.   ALLERGIES: Allergies  Allergen Reactions   Nsaids Other (See Comments)    Stomach ulcers   Seasonal Ic  [Cholestatin]      HOME MEDICATIONS: Outpatient Medications Prior to Visit  Medication Sig Dispense Refill   acetaminophen (TYLENOL) 650 MG CR tablet Take 650 mg by mouth 2 (two) times daily.     ALPRAZolam (XANAX) 0.5 MG tablet Take 0.5 tablets by mouth 2 (two) times daily. (Patient not taking: Reported on 04/23/2022)     Ascorbic Acid (VITAMIN C) 1000 MG tablet Take 1,000 mg by mouth daily.     aspirin EC 81 MG tablet Take 81 mg by mouth daily.     Calcium Carbonate-Vit D-Min (CALCIUM 1200 PO) Take 1,200 mg by mouth daily.     Cholecalciferol (VITAMIN D3) 5000 units CAPS Take 5,000 Units by mouth daily.     Coenzyme Q10 (COQ-10) 200 MG CAPS Take 200 mg by mouth daily. (Patient taking differently: Take 200 mg by  mouth 2 (two) times daily.)  0   famotidine (PEPCID) 10 MG tablet Take 10 mg by mouth 2 (two) times daily as needed for heartburn or indigestion.      fexofenadine (ALLEGRA) 180 MG tablet Take 180 mg by mouth daily.     fluticasone-salmeterol (WIXELA INHUB) 500-50 MCG/ACT AEPB Inhale 1 puff into the lungs in the morning and at bedtime.     gabapentin (NEURONTIN) 300 MG capsule Take 2 capsules (600 mg total) by  mouth 2 (two) times daily. 600mg  in the evening and 600mg  at bedtime 360 capsule 0   levOCARNitine (L-CARNITINE PO) Take 1,000 mg by mouth.     levothyroxine (SYNTHROID, LEVOTHROID) 50 MCG tablet Take 50 mcg by mouth daily before breakfast.     nortriptyline (PAMELOR) 10 MG capsule Take 2 capsules (20 mg total) by mouth at bedtime. 180 capsule 3   OXcarbazepine (TRILEPTAL) 150 MG tablet Take 1 tablet (150 mg total) by mouth 2 (two) times daily. 180 tablet 1   rosuvastatin (CRESTOR) 10 MG tablet Take 10 mg by mouth every evening.      tiotropium (SPIRIVA) 18 MCG inhalation capsule Place 18 mcg into inhaler and inhale daily.     traMADol (ULTRAM) 50 MG tablet Take 50 mg by mouth. Takes 2-3 per day prn.  (2x max usually)     vitamin B-12 (CYANOCOBALAMIN) 1000 MCG tablet Take 1,000 mcg by mouth daily.      zinc gluconate 50 MG tablet Take 50 mg by mouth daily.     No facility-administered medications prior to visit.     PAST MEDICAL HISTORY: Past Medical History:  Diagnosis Date   Cataracts, bilateral    Fibroleiomyoma    Fibromyalgia    Glaucoma    History of stomach ulcers    Neuropathy of leg    Pneumonia    11/21   Vision abnormalities      PAST SURGICAL HISTORY: Past Surgical History:  Procedure Laterality Date   bone spur toe left foot     carpal tunnel both hands  2005-2006   fusion lower back  11/2008   fusion lower back  10/1998   fusion to lower back  11/2010   knee arthoscopy  1995/2000   five on right leg   ulkna release right  2006     FAMILY  HISTORY: Family History  Problem Relation Age of Onset   Transient ischemic attack Father      SOCIAL HISTORY: Social History   Socioeconomic History   Marital status: Married    Spouse name: Not on file   Number of children: Not on file   Years of education: Not on file   Highest education level: Not on file  Occupational History   Occupation: Public librarian  Tobacco Use   Smoking status: Never   Smokeless tobacco: Never  Vaping Use   Vaping status: Never Used  Substance and Sexual Activity   Alcohol use: No   Drug use: No   Sexual activity: Not on file  Other Topics Concern   Not on file  Social History Narrative   Lives at home w/ her husband   Right-caffeine   Caffeine: 1 large cup of coffee per day   Social Determinants of Health   Financial Resource Strain: Not on file  Food Insecurity: Not on file  Transportation Needs: Not on file  Physical Activity: Not on file  Stress: Not on file  Social Connections: Unknown (06/19/2021)   Received from [facility]   Social Network    Social Network: Not on file  Intimate Partner Violence: Unknown (05/11/2021)   Received from [facility], [facility]   HITS    Physically Hurt: Not on file    Insult or Talk Down To: Not on file    Threaten Physical Harm: Not on file    Scream or Curse: Not on file      PHYSICAL EXAM  There were no vitals filed for  this visit.   There is no height or weight on file to calculate BMI.   Generalized: Well developed, in no acute distress  Cardiology: normal rate and rhythm, no murmur auscultated  Respiratory: clear to auscultation bilaterally    Neurological examination  Mentation: Alert oriented to time, place, history taking. Follows all commands speech and language fluent Cranial nerve II-XII: Pupils were equal round reactive to light. Extraocular movements were full, visual field were full on confrontational test. Facial sensation and strength were  normal. Head turning and shoulder shrug  were normal and symmetric. Motor: The motor testing reveals 5 over 5 strength of all 4 extremities. Good symmetric motor tone is noted throughout.  Sensory: Sensory testing is intact to soft touch and pinprick on all 4 extremities with exception of slight decreased sensation of medial portion of right foot. No evidence of extinction is noted.  Coordination: Cerebellar testing reveals good finger-nose-finger and heel-to-shin bilaterally.  Gait and station: Gait is normal.      DIAGNOSTIC DATA (LABS, IMAGING, TESTING) - I reviewed patient records, labs, notes, testing and imaging myself where available.  Lab Results  Component Value Date   WBC 5.6 09/21/2016   HGB 13.6 10/04/2016   HCT 40.0 10/04/2016   MCV 93.2 09/21/2016   PLT 231 09/21/2016      Component Value Date/Time   NA 143 04/23/2022 0821   K 4.4 04/23/2022 0821   CL 102 04/23/2022 0821   CO2 25 04/23/2022 0821   GLUCOSE 84 04/23/2022 0821   GLUCOSE 83 10/04/2016 1207   BUN 11 04/23/2022 0821   CREATININE 0.68 04/23/2022 0821   CALCIUM 10.3 04/23/2022 0821   PROT 7.1 04/23/2022 0821   ALBUMIN 4.8 04/23/2022 0821   AST 25 04/23/2022 0821   ALT 22 04/23/2022 0821   ALKPHOS 98 04/23/2022 0821   BILITOT 0.4 04/23/2022 0821   GFRNONAA >60 09/21/2016 0914   GFRAA >60 09/21/2016 0914   Lab Results  Component Value Date   CHOL 198 09/21/2016   HDL 48 09/21/2016   LDLCALC 92 09/21/2016   TRIG 292 (H) 09/21/2016   CHOLHDL 4.1 09/21/2016   Lab Results  Component Value Date   HGBA1C 5.5 09/21/2016   Lab Results  Component Value Date   VITAMINB12 >2000 (H) 04/23/2022   Lab Results  Component Value Date   TSH 10.130 (H) 09/04/2016      ASSESSMENT AND PLAN  67 y.o. year old female  has a past medical history of Cataracts, bilateral, Fibroleiomyoma, Fibromyalgia, Glaucoma, History of stomach ulcers, Neuropathy of leg, Pneumonia, and Vision abnormalities. here with   No  diagnosis found.   Haddi has had progressive pain over the past year. We will continue gabapentin 600mg  BID and nortriptyline 20mg  daily. I will add oxcarbazepine 150mg  BID. She was advised to start 150mg  at bedtime for 1 week then increase to BID dosing. Could increase to 300mg  BID in future if tolerated wel. Would like to wean gabapentin in the future if able. We will check chem and B12. She will continue oral B12 for now. She will continue close follow up with Dr Ethelene Hal. Could consider Qutenza patches for neuropathy management if she wishes. Will be happy to place referral if Dr Ethelene Hal does not offer. Healthy lifestyle habits encouraged. She will continue close follow-up with primary care and care team. Will monitor sodium.  She will follow-up with me in 6 months, sooner if needed.  She verbalizes understanding and agreement with this plan.  No orders of the defined types were placed in this encounter.   No orders of the defined types were placed in this encounter.    I spent 30 minutes of face-to-face and non-face-to-face time with patient.  This included previsit chart review, lab review, study review, order entry, electronic health record documentation, patient education.   Shawnie Dapper, MSN, FNP-C 12/18/2022, 3:53 PM  [facility] [address], [address] [place], [place] [address] [phone]

## 2022-12-20 ENCOUNTER — Ambulatory Visit: Payer: Medicare Other | Admitting: Family Medicine

## 2022-12-20 ENCOUNTER — Encounter: Payer: Self-pay | Admitting: Family Medicine

## 2022-12-20 VITALS — BP 118/85 | HR 95 | Ht 63.0 in | Wt 135.5 lb

## 2022-12-20 DIAGNOSIS — R200 Anesthesia of skin: Secondary | ICD-10-CM | POA: Diagnosis not present

## 2022-12-20 DIAGNOSIS — G609 Hereditary and idiopathic neuropathy, unspecified: Secondary | ICD-10-CM

## 2022-12-20 DIAGNOSIS — Z8673 Personal history of transient ischemic attack (TIA), and cerebral infarction without residual deficits: Secondary | ICD-10-CM

## 2022-12-20 MED ORDER — DULOXETINE HCL 30 MG PO CPEP: 30.0000 mg | ORAL_CAPSULE | Freq: Every day | ORAL | 1 refills | Status: DC

## 2022-12-25 ENCOUNTER — Encounter: Payer: Self-pay | Admitting: Family Medicine

## 2022-12-27 ENCOUNTER — Other Ambulatory Visit (INDEPENDENT_AMBULATORY_CARE_PROVIDER_SITE_OTHER): Payer: Self-pay

## 2022-12-27 DIAGNOSIS — Z0289 Encounter for other administrative examinations: Secondary | ICD-10-CM

## 2022-12-27 DIAGNOSIS — G609 Hereditary and idiopathic neuropathy, unspecified: Secondary | ICD-10-CM

## 2022-12-28 LAB — SODIUM: Sodium: 144 mmol/L (ref 134–144)

## 2023-01-08 ENCOUNTER — Ambulatory Visit: Payer: Medicare Other | Admitting: Family Medicine

## 2023-04-03 ENCOUNTER — Institutional Professional Consult (permissible substitution) (HOSPITAL_BASED_OUTPATIENT_CLINIC_OR_DEPARTMENT_OTHER): Payer: Medicare Other | Admitting: Pulmonary Disease

## 2023-05-08 ENCOUNTER — Other Ambulatory Visit: Payer: Self-pay | Admitting: Family Medicine

## 2023-05-08 DIAGNOSIS — G609 Hereditary and idiopathic neuropathy, unspecified: Secondary | ICD-10-CM

## 2023-05-09 ENCOUNTER — Other Ambulatory Visit: Payer: Self-pay | Admitting: Family Medicine

## 2023-05-09 MED ORDER — GABAPENTIN 300 MG PO CAPS: 600.0000 mg | ORAL_CAPSULE | Freq: Two times a day (BID) | ORAL | 0 refills | Status: DC

## 2023-05-09 MED ORDER — OXCARBAZEPINE 150 MG PO TABS: 150.0000 mg | ORAL_TABLET | Freq: Two times a day (BID) | ORAL | 1 refills | Status: DC

## 2023-05-09 NOTE — Telephone Encounter (Signed)
 Last seen 12/20/22 per note "  I will wean oxcarb. She will take 150mg  daily for 1 week then discontinue.   Follow up scheduled on 07/15/23

## 2023-05-20 ENCOUNTER — Encounter: Payer: Self-pay | Admitting: Family Medicine

## 2023-07-15 ENCOUNTER — Ambulatory Visit: Payer: Medicare Other | Admitting: Family Medicine

## 2023-07-26 ENCOUNTER — Encounter: Payer: Self-pay | Admitting: Family Medicine

## 2023-07-26 DIAGNOSIS — G609 Hereditary and idiopathic neuropathy, unspecified: Secondary | ICD-10-CM

## 2023-07-29 ENCOUNTER — Other Ambulatory Visit: Payer: Self-pay

## 2023-07-30 MED ORDER — GABAPENTIN 300 MG PO CAPS
600.0000 mg | ORAL_CAPSULE | Freq: Two times a day (BID) | ORAL | 0 refills | Status: DC
Start: 2023-07-30 — End: 2023-07-30

## 2023-07-30 MED ORDER — GABAPENTIN 300 MG PO CAPS: 600.0000 mg | ORAL_CAPSULE | Freq: Two times a day (BID) | ORAL | 1 refills | Status: DC

## 2023-07-30 NOTE — Addendum Note (Signed)
 Addended by: CARY NO L on: 07/30/2023 07:44 AM   Modules accepted: Orders

## 2023-08-15 ENCOUNTER — Encounter: Payer: Self-pay | Admitting: Family Medicine

## 2023-08-15 NOTE — Telephone Encounter (Signed)
 Patient ask to schedule appt with Dr. Ines No, NP August 18-August 22. Informed patient there no availability. Patient will call back when check schedule.

## 2023-08-16 NOTE — Telephone Encounter (Signed)
 Pt has been scheduled. This was taken care of 08/15/23

## 2023-08-21 ENCOUNTER — Encounter: Payer: Self-pay | Admitting: Family Medicine

## 2023-08-23 ENCOUNTER — Encounter: Payer: Self-pay | Admitting: Family Medicine

## 2023-08-27 ENCOUNTER — Other Ambulatory Visit: Payer: Self-pay

## 2023-09-25 NOTE — Progress Notes (Unsigned)
 No chief complaint on file.   HISTORY OF PRESENT ILLNESS:  09/25/23 ALL:  Myria returns for follow up for neuropathy. She was last seen 12/2022 and reported worsening generalized pain most consistent with fibromyalgia. Neuropathy was fairly stable at that time. We continued gabapentin  and nortriptyline  but weaned oxcarb and added duloxetine . She did not tolerate duloxetine  due to dyspepsia and continued oxcarb. She reached out to me 08/2023 with a collage of new/worsening symptoms.   12/20/2022 ALL:  Minha returns for follow up for neuropathy. She was last seen 04/2022. We added oxcarbazepine  and continued gabapentin  and nortriptyline . Since, she reports no improvement in pain. She is having more burning pain of bilateral lower extremities, esp calves and shins. She has numbness of both feet and tinging in all toes but not overly painful. She denies weakness of extremities. She is having generalized pains most consistent with previous diagnosis of fibro. She is under significant stress. Son was diagnosed with non Hodgkin lymphoma. She spends a lot of her time in [place]  when she can helping her daughter who is in the [employer].  She was previously on duloxetine  with initial fibro diagnosis but was stopped when she started feeling better. She does not feel oxcarb has been helpful. Recent labs with PCP fairly stable. Na 133.   04/23/2022 ALL: Dvora returns for follow up for neuropathy. She continues gabapentin  600mg  BID and nortriptyline  20mg  at bedtime. She reports worsening of neuropathy pain since last visit. She is having more pain from just above knees to feet. Pain described as burning. Waxes and wanes. Pain is worse at night. She reports first dose of gabapentin  600mg  taken at 6pm makes her feel really loopy for about an hour. She takes second dose of gabapentin  600mg  with nortriptyline  20mg  at bedtime around 9-10pm. She is usually able to go right to sleep but has had some difficulty with getting to  sleep on nights when pain is bad. She has also noted more sharp pain of the right anterior thigh and lateral calf when driving longer distances. Pain gets better when she moves around. She is followed by Dr Bonner for chronic back pain post surgery. She is followed by PCP regularly. Recently diagnosed with Hashimoto. She continues B12 oral supplements.   03/10/21 ALL (Mychart): Mersedes Alber is a 68 y.o. female here today for follow up for neuropathy. She continues gabapentin  600mg  in the evening and 600mg  at bedtime. She also takes nortriptyline  20mg  at bedtime. She feels that burning in calf is mostly stable. She has bad nights from time to time. Legs feel like they are burning and very sensitive to touch. She also describes a cramping sensation with electrical pains that shoot through legs when she sits still. Improved with movement. She uses roll on lidocaine that provides temporary relief. She has noticed more numbness in her toes over the past could of months. She has a history of B12 def. She takes B12 1000mcg daily. She is stay in [place] with her daughter who recently had a baby. She comes home to [place] once a month. No recent concerns of TIA. She is followed regularly by PCP. BP well managed. Cholesterol well managed on rosuvastatin .   06/07/2020 ALL: She returns for follow up for idiopathic neuropathy. We increased gabapentin  to 300/300/600 and continued nortriptyline  20mg  daily at last visit in 01/2020. She reports that she could not tolerate side effects. She has continued 600mg  at 6pm and 600mg  at bedtime. Neuropathy pain is unchanged. Pain waxes and  wanes.   She was diagnosed with Covid in 01/2020. She had pneumonia in March 2022. After recovering, she has noted a sharp pain that starts in her calf and radiates upward to the back of her thigh, turns to a throbbing pain by end of the day. Worse when sitting. Not burning or tingling. Feels different from neuropathy pain. Her back has been  hurting more than normal over the same period of time. She feels that she has more fibromyalgia pain following Covid. She hurts all over. She has a new grandson on the way and wants to go to [place] to help care for him. She denies weakness. No falls. No changes in gait. She can not take Nsaids due to history of gastric ulcers. She is followed by Dr Bonner with [facility].    01/13/2020 ALL:  Yerania Chamorro is a 68 y.o. female here today for follow up for neuropathy. She continues gabapentin  600mg  at 6pm and 300mg  at 9pm. She also continues nortriptyline  20mg  for neuropathic pain and insomnia. She reports that burning sensation is spreading. She is now experiencing electrical/burning sensation of bilateral calves that spreads just above both knees and to both ankles. Over the past few months, she started noticing pins and needles in both feet.   She continues to follow up with pain management. She has tried multiple procedures and medications to help manage low back pain. She has a significant history of degenerative changes. She has failed two lumbar fusions. She has failed ESI, Ketamine and multiple other pain medications. She is now taking tramadol  2 times daily (prescribed for up to 4 times a day). Thyroid  has been well managed. She continues rosuvastatin . No recent TIA symptoms.    HISTORY (copied from previous note)  Kariyah Baugh is a 68 y.o. female here today for follow up for idiopathic peripheral neuropathy. Numbness and tingling in feet continue but she is most bothered by leg pain. Pain is worsening over the past few months. Pain is sharp and feels like electricity in bilateral legs. Pain is all day but worse at night. She had a NCS in [place]  and told that she did not have RLS. Has history of low back pain but does not feel this is related. She is failed Cymbalta  and Lyrica in the past. She has tried RLS medications (unsure of the name) that did not help. She has fibromyalgia but feel this is under  control at this time. She is taking gabapentin  600mg  at 6pm and 300mg  at bedtime (9-10pm). She is also taking nortriptyline  10mg  at bedtime that has helped some. Alpha lipoic acid did not help.  She is managed by Dr Bonner for pain management using injection therapy every 6 months. She takes Tramadol  1-2 tablets daily.    She continues rosuvastatin  and asa 81mg  for stroke prevention. BP is normal.    HISTORY: (copied from my note on 04/01/2018)   Brittnay Pigman is a 68 y.o. female here today for follow up for idiopathic peripheral neuropathy. She continues gabapentin  600mg  in the evenings. She has tried to increase dose but suffers with daytime sleepiness. She was started on nortriptyline  10mg  at bedtime in 08/2017. She does feel that this helps some. It also helps her sleep but she continues to wake up in the middle of the night. 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 She has her adult son living with her who suffers from depression with suicidal ideations. Her husband is retired.   She  had a really bad headache about a month ago that lasted for about 5 days. She reports that she had blurred vision and significant pain in her neck with headache. She has a history of migraines but states that she has not had one in years. She reports headache finally resolved after taking Tylenol . She denies vision loss, confusion, trouble speaking or weakness with headache. She does feel that her feeling is off on the bottoms of her feet and occasionally she will miss a step. She denies falls or trouble walking. She has participated in PT in the past but dies not feel it helps. She states that this headache event was not similar to her previous TIA.     She is continuing Crestor  and asa for stroke prevention. Recent labs with PCP were perfect with the exception of A1C of 5.7. She has gained about 8 pounds in the past 2-3 months.    HISTORY: (copied from [employer] note on  09/04/2017) UPDATE 7/31/2019CM Ms.Mcglamery, 68 year old female returns for follow-up with history of idiopathic peripheral neuropathy.  She continues to complain with burning electric-like pain in both lower extremities.  She has failed Cymbalta  and Lyrica in the past.  She is currently on gabapentin  300 mg 1 morning and 2 (2) hours before bedtime.  Unable to increase dose due to daytime drowsiness in the morning.  Her neuropathy pain wakes her up at night and she has trouble sleeping. Past medical history osteoarthritis, fibromyalgia,posterior vitreous detachment both eyes, migraines and TIA x2.  She is currently on aspirin  and Crestor  for secondary stroke prevention.  She returns for reevaluation.   09/04/16 AA Conda Wannamaker is a 68 y.o. female here as a referral from Dr. No ref. provider found for blurred vision and vision loss of left eye. Past medical history osteoarthritis, fibromyalgia,posterior vitreous detachment both eyes, migraines. Patient reports an episode of painless, complete loss of vision left eye in January which lasted about 3 minutes and self resolved. Has not recurred. Ophthalmologist does not feel this episode is explained by eye findings. Suggested evaluation for TIA or stroke.Happened in January, she was walking to the cafeteria and all of a sudden some pulled a curtain over her left eye, it was completely black, couldn;t see light or shaows. Lasted a minute and went away. She continued to have blurred vision and headaches. The blurred vision is betetr, headaches improved. The left eye stayed blurred for 3 weeks and daily headaches. Still with headaches occasionally. No pain on eye movement. The headache is an ache on the right. She has a history of migraines last one 22 years ago. She is not on a baby aspirin . No other focal neurologic deficits, associated symptoms, inciting events or modifiable factors. Echo showed grade 1 diastolic dysfunction and she was asked to follow up with  primary care, but no thrombus or causes for TIA seen on echocardiogram.   REVIEW OF SYSTEMS: Out of a complete 14 system review of symptoms, the patient complains only of the following symptoms, neuropathic pain, low back pain, right leg pain and all other reviewed systems are negative.   ALLERGIES: Allergies  Allergen Reactions   Nsaids Other (See Comments)    Stomach ulcers   Seasonal Ic  [Cholestatin]      HOME MEDICATIONS: Outpatient Medications Prior to Visit  Medication Sig Dispense Refill   acetaminophen  (TYLENOL ) 650 MG CR tablet Take 650 mg by mouth 2 (two) times daily.     Ascorbic Acid  (VITAMIN C ) 1000 MG  tablet Take 1,000 mg by mouth daily.     aspirin  EC 81 MG tablet Take 81 mg by mouth daily.     Calcium  Carbonate-Vit D-Min (CALCIUM  1200 PO) Take 1,200 mg by mouth daily.     Cholecalciferol  (VITAMIN D3) 5000 units CAPS Take 5,000 Units by mouth daily.     DULoxetine  (CYMBALTA ) 30 MG capsule Take 1 capsule (30 mg total) by mouth daily. 90 capsule 1   famotidine  (PEPCID ) 10 MG tablet Take 10 mg by mouth 2 (two) times daily as needed for heartburn or indigestion.      fexofenadine (ALLEGRA) 180 MG tablet Take 180 mg by mouth daily.     fluticasone-salmeterol (WIXELA INHUB) 500-50 MCG/ACT AEPB Inhale 1 puff into the lungs in the morning and at bedtime.     gabapentin  (NEURONTIN ) 300 MG capsule Take 2 capsules (600 mg total) by mouth 2 (two) times daily. 600mg  in the evening and 600mg  at bedtime 360 capsule 1   hydroxychloroquine (PLAQUENIL) 200 MG tablet Take 300 mg by mouth daily.     levOCARNitine (L-CARNITINE PO) Take 1,000 mg by mouth.     levothyroxine (SYNTHROID, LEVOTHROID) 50 MCG tablet Take 50 mcg by mouth daily before breakfast.     nortriptyline  (PAMELOR ) 10 MG capsule Take 2 capsules (20 mg total) by mouth at bedtime. 180 capsule 3   OXcarbazepine  (TRILEPTAL ) 150 MG tablet Take 1 tablet (150 mg total) by mouth 2 (two) times daily. 180 tablet 1   rosuvastatin   (CRESTOR ) 10 MG tablet Take 10 mg by mouth every evening.      tiotropium (SPIRIVA) 18 MCG inhalation capsule Place 18 mcg into inhaler and inhale daily.     traMADol  (ULTRAM ) 50 MG tablet Take 50 mg by mouth. Takes 2-3 per day prn.  (2x max usually)     vitamin B-12 (CYANOCOBALAMIN ) 1000 MCG tablet Take 1,000 mcg by mouth daily.      zinc gluconate 50 MG tablet Take 50 mg by mouth daily.     No facility-administered medications prior to visit.     PAST MEDICAL HISTORY: Past Medical History:  Diagnosis Date   Cataracts, bilateral    Fibroleiomyoma    Fibromyalgia    Glaucoma    History of stomach ulcers    Neuropathy of leg    Pneumonia    11/21   Vision abnormalities      PAST SURGICAL HISTORY: Past Surgical History:  Procedure Laterality Date   bone spur toe left foot     carpal tunnel both hands  2005-2006   fusion lower back  11/2008   fusion lower back  10/1998   fusion to lower back  11/2010   knee arthoscopy  1995/2000   five on right leg   ulkna release right  2006     FAMILY HISTORY: Family History  Problem Relation Age of Onset   Transient ischemic attack Father      SOCIAL HISTORY: Social History   Socioeconomic History   Marital status: Married    Spouse name: Not on file   Number of children: Not on file   Years of education: Not on file   Highest education level: Not on file  Occupational History   Occupation: Public librarian  Tobacco Use   Smoking status: Never   Smokeless tobacco: Never  Vaping Use   Vaping status: Never Used  Substance and Sexual Activity   Alcohol use: No   Drug use: No   Sexual activity: Not  on file  Other Topics Concern   Not on file  Social History Narrative   Lives at home w/ her husband   Right-caffeine   Caffeine: 1 large cup of coffee per day   Social Drivers of Health   Financial Resource Strain: Not on file  Food Insecurity: Not on file  Transportation Needs: Not on file  Physical Activity: Not on  file  Stress: Not on file  Social Connections: Unknown (06/19/2021)   Received from [facility]   Social Network    Social Network: Not on file  Intimate Partner Violence: Unknown (05/11/2021)   Received from [facility]   HITS    Physically Hurt: Not on file    Insult or Talk Down To: Not on file    Threaten Physical Harm: Not on file    Scream or Curse: Not on file      PHYSICAL EXAM  There were no vitals filed for this visit.    There is no height or weight on file to calculate BMI.   Generalized: Well developed, in no acute distress  Cardiology: normal rate and rhythm, no murmur auscultated  Respiratory: clear to auscultation bilaterally    Neurological examination  Mentation: Alert oriented to time, place, history taking. Follows all commands speech and language fluent Cranial nerve II-XII: Pupils were equal round reactive to light. Extraocular movements were full, visual field were full on confrontational test. Facial sensation and strength were normal. Head turning and shoulder shrug  were normal and symmetric. Motor: The motor testing reveals 5 over 5 strength of all 4 extremities. Good symmetric motor tone is noted throughout.  Sensory: Sensory testing is intact to soft touch and pinprick on all 4 extremities with exception of slight decreased sensation of medial portion of right foot. No evidence of extinction is noted.  Coordination: Cerebellar testing reveals good finger-nose-finger and heel-to-shin bilaterally.  Gait and station: Gait is normal.      DIAGNOSTIC DATA (LABS, IMAGING, TESTING) - I reviewed patient records, labs, notes, testing and imaging myself where available.  Lab Results  Component Value Date   WBC 5.6 09/21/2016   HGB 13.6 10/04/2016   HCT 40.0 10/04/2016   MCV 93.2 09/21/2016   PLT 231 09/21/2016      Component Value Date/Time   NA 144 12/27/2022 1026   K 4.4 04/23/2022 0821   CL 102 04/23/2022 0821   CO2 25 04/23/2022 0821    GLUCOSE 84 04/23/2022 0821   GLUCOSE 83 10/04/2016 1207   BUN 11 04/23/2022 0821   CREATININE 0.68 04/23/2022 0821   CALCIUM  10.3 04/23/2022 0821   PROT 7.1 04/23/2022 0821   ALBUMIN 4.8 04/23/2022 0821   AST 25 04/23/2022 0821   ALT 22 04/23/2022 0821   ALKPHOS 98 04/23/2022 0821   BILITOT 0.4 04/23/2022 0821   GFRNONAA >60 09/21/2016 0914   GFRAA >60 09/21/2016 0914   Lab Results  Component Value Date   CHOL 198 09/21/2016   HDL 48 09/21/2016   LDLCALC 92 09/21/2016   TRIG 292 (H) 09/21/2016   CHOLHDL 4.1 09/21/2016   Lab Results  Component Value Date   HGBA1C 5.5 09/21/2016   Lab Results  Component Value Date   VITAMINB12 >2000 (H) 04/23/2022   Lab Results  Component Value Date   TSH 10.130 (H) 09/04/2016      ASSESSMENT AND PLAN  68 y.o. year old female  has a past medical history of Cataracts, bilateral, Fibroleiomyoma, Fibromyalgia, Glaucoma, History of  stomach ulcers, Neuropathy of leg, Pneumonia, and Vision abnormalities. here with   No diagnosis found.  Kelty has had progressive pain over the past year. We will continue gabapentin  600mg  BID and nortriptyline  20mg  daily. I will wean oxcarb. She will take 150mg  daily for 1 week then discontinue. She will start duloxetine  30mg  daily. I have educated her on potential for hyponatremia. She will monitor closely and return for sodium level 3-5 days after starting duloxetine . Healthy lifestyle habits encouraged. She will continue close follow-up with primary care and care team. Will monitor sodium.  She will follow-up with me in 6 months, sooner if needed.  She verbalizes understanding and agreement with this plan.   No orders of the defined types were placed in this encounter.   No orders of the defined types were placed in this encounter.    I spent 30 minutes of face-to-face and non-face-to-face time with patient.  This included previsit chart review, lab review, study review, order entry, electronic health  record documentation, patient education.   Greig Forbes, MSN, FNP-C 09/25/2023, 9:07 AM  [facility] [address], [address] [place], [place] [address] [phone]

## 2023-09-25 NOTE — Patient Instructions (Incomplete)
 Below is our plan:  We will check labs and order MRI. I will call you with results. Increase evening dose of oxcarbazepine  to 300mg  but continue 150mg  in am. Continue gabapentin  600mg  twice daily and nortriptyline  20mg  daily. I have ordered a nerve conduction study to look for other causes of neuropathy. CIDP (chronic inflammatory demyelinating polyneuropathy) and Transthyretin amyloidosis are the conditions we discussed in the office today. I am not sure we are worried about these but want to make sure we aren't missing something.   Please make sure you are staying well hydrated. I recommend 50-60 ounces daily. Well balanced diet and regular exercise encouraged. Consistent sleep schedule with 6-8 hours recommended.   Please continue follow up with care team as directed.   Follow up with me in 6 months  You may receive a survey regarding today's visit. I encourage you to leave honest feed back as I do use this information to improve patient care. Thank you for seeing me today!

## 2023-09-26 ENCOUNTER — Ambulatory Visit: Admitting: Family Medicine

## 2023-09-26 ENCOUNTER — Encounter: Payer: Self-pay | Admitting: Family Medicine

## 2023-09-26 VITALS — BP 118/76 | HR 83 | Ht 63.0 in | Wt 107.5 lb

## 2023-09-26 DIAGNOSIS — Z8673 Personal history of transient ischemic attack (TIA), and cerebral infarction without residual deficits: Secondary | ICD-10-CM | POA: Diagnosis not present

## 2023-09-26 DIAGNOSIS — G609 Hereditary and idiopathic neuropathy, unspecified: Secondary | ICD-10-CM

## 2023-09-26 DIAGNOSIS — R200 Anesthesia of skin: Secondary | ICD-10-CM | POA: Diagnosis not present

## 2023-09-26 DIAGNOSIS — E538 Deficiency of other specified B group vitamins: Secondary | ICD-10-CM

## 2023-09-26 DIAGNOSIS — R2689 Other abnormalities of gait and mobility: Secondary | ICD-10-CM

## 2023-09-26 DIAGNOSIS — G4452 New daily persistent headache (NDPH): Secondary | ICD-10-CM

## 2023-09-26 DIAGNOSIS — R531 Weakness: Secondary | ICD-10-CM

## 2023-09-26 MED ORDER — GABAPENTIN 300 MG PO CAPS
600.0000 mg | ORAL_CAPSULE | Freq: Two times a day (BID) | ORAL | 3 refills | Status: AC
Start: 2023-09-26 — End: ?

## 2023-09-26 MED ORDER — OXCARBAZEPINE 150 MG PO TABS: ORAL_TABLET | ORAL | 3 refills | Status: DC

## 2023-09-26 MED ORDER — NORTRIPTYLINE HCL 10 MG PO CAPS: 20.0000 mg | ORAL_CAPSULE | Freq: Every day | ORAL | 3 refills | Status: DC

## 2023-09-27 ENCOUNTER — Telehealth: Payer: Self-pay | Admitting: Family Medicine

## 2023-09-27 NOTE — Telephone Encounter (Signed)
 MRI order sent to: Needs to have scheduled in [place] . [facility]. [address] [place] [address]. P# [phone], F# [phone]   No PA required

## 2023-09-30 LAB — GENESEQ PLUS, TTR

## 2023-10-01 ENCOUNTER — Ambulatory Visit: Payer: Self-pay | Admitting: Family Medicine

## 2023-10-01 ENCOUNTER — Encounter: Payer: Self-pay | Admitting: Family Medicine

## 2023-10-04 LAB — MULTIPLE MYELOMA PANEL, SERUM
Albumin SerPl Elph-Mcnc: 3.7 g/dL (ref 2.9–4.4)
Albumin/Glob SerPl: 1.2 (ref 0.7–1.7)
Alpha 1: 0.3 g/dL (ref 0.0–0.4)
Alpha2 Glob SerPl Elph-Mcnc: 0.8 g/dL (ref 0.4–1.0)
B-Globulin SerPl Elph-Mcnc: 1 g/dL (ref 0.7–1.3)
Gamma Glob SerPl Elph-Mcnc: 1.1 g/dL (ref 0.4–1.8)
Globulin, Total: 3.3 g/dL (ref 2.2–3.9)
IgA/Immunoglobulin A, Serum: 184 mg/dL (ref 87–352)
IgG (Immunoglobin G), Serum: 1096 mg/dL (ref 586–1602)
IgM (Immunoglobulin M), Srm: 57 mg/dL (ref 26–217)

## 2023-10-04 LAB — NEUROPATHY PANEL
Angio Convert Enzyme: 39 U/L (ref 14–82)
Anti Nuclear Antibody (ANA): POSITIVE — AB
Rheumatoid fact SerPl-aCnc: <10 [IU]/mL (ref <14.0)
Sed Rate: 8 mm/h (ref 0–40)
TSH: 0.826 u[IU]/mL (ref 0.450–4.500)
Total Protein: 7 g/dL (ref 6.0–8.5)
Vit D, 25-Hydroxy: 69.8 ng/mL (ref 30.0–100.0)
Vitamin B-12: >2000 pg/mL — ABNORMAL HIGH (ref 232–1245)

## 2023-10-04 LAB — GENESEQ PLUS, TTR

## 2023-10-16 ENCOUNTER — Encounter: Payer: Self-pay | Admitting: Family Medicine

## 2023-10-16 ENCOUNTER — Telehealth: Payer: Self-pay | Admitting: *Deleted

## 2023-10-16 NOTE — Telephone Encounter (Signed)
 SABRA

## 2023-10-16 NOTE — Telephone Encounter (Signed)
 Gave to Amy to review

## 2023-10-16 NOTE — Telephone Encounter (Signed)
 Received mri reports pt reports are in nurse pod.

## 2023-10-17 ENCOUNTER — Encounter: Payer: Self-pay | Admitting: Family Medicine

## 2023-10-21 ENCOUNTER — Encounter: Payer: Self-pay | Admitting: Family Medicine

## 2023-10-23 ENCOUNTER — Telehealth: Payer: Self-pay | Admitting: Family Medicine

## 2023-10-23 NOTE — Telephone Encounter (Signed)
 conflict, will call back to r/s

## 2023-11-03 ENCOUNTER — Encounter: Payer: Self-pay | Admitting: Family Medicine

## 2023-12-03 ENCOUNTER — Telehealth: Payer: Self-pay | Admitting: Family Medicine

## 2023-12-03 MED ORDER — NORTRIPTYLINE HCL 10 MG PO CAPS: 20.0000 mg | ORAL_CAPSULE | Freq: Every day | ORAL | 3 refills | Status: AC

## 2023-12-03 NOTE — Telephone Encounter (Signed)
 Refill sent as requested.

## 2023-12-03 NOTE — Telephone Encounter (Signed)
 REF # [id number]

## 2023-12-03 NOTE — Telephone Encounter (Signed)
 [facility] called to  request medication refill   nortriptyline  (PAMELOR ) 10 MG capsule   Pt medication is to be sent to   [facility] Address: [address] [place], [place], [place] [address] Phone: [phone] Fax [phone]

## 2023-12-13 ENCOUNTER — Encounter: Admitting: Neurology

## 2024-01-08 ENCOUNTER — Ambulatory Visit: Admitting: Neurology

## 2024-01-08 DIAGNOSIS — G609 Hereditary and idiopathic neuropathy, unspecified: Secondary | ICD-10-CM | POA: Diagnosis not present

## 2024-01-08 DIAGNOSIS — R531 Weakness: Secondary | ICD-10-CM

## 2024-01-08 DIAGNOSIS — R2689 Other abnormalities of gait and mobility: Secondary | ICD-10-CM

## 2024-01-08 MED ORDER — TRAZODONE HCL 50 MG PO TABS: 100.0000 mg | ORAL_TABLET | Freq: Every day | ORAL | 11 refills | Status: AC
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
## 2024-01-08 NOTE — Procedures (Signed)
 Full Name: Rebecca Rose Gender: Female MRN #: [id number] Date of Birth: 28-Aug-1955    Visit Date: 01/08/2024 08:42 Age: 68 Years Examining Physician: Onita Duos Referring Physician: Amy Height: 5 feet 3 inch History: 68 years old female started to have bilateral calf area discomfort burning pain 20 years ago, gradually getting worse, was given the diagnosis of peripheral neuropathy, over the years, was treated with titrating dose of neuropathic pain medicine with limited help  Summary of the test: Nerve conduction study: Bilateral sural sensory responses were present within normal limit.  Right ulnar sensory responses were normal, right median sensory response showed mildly prolonged peak latency well-preserved snap amplitude.  Bilateral tibial, left peroneal to EDB motor responses were normal.  Right peroneal to EDB motor response showed mildly decreased CMAP amplitude.  Right median, ulnar motor responses were normal.  Electromyography: Select needle examination of right upper, lower extremity muscles, cervical and lumbosacral paraspinal muscles were normal   Conclusion: This is a mild abnormal study.  There is electrodiagnostic evidence of mild right distal median neuropathy across the wrist consistent with mild right carpal tunnel syndromes.  There was no clear evidence of large fiber peripheral neuropathy, active right lumbar radiculopathy or intrinsic muscle disease.    ------------------------------- Duos Onita. M.D. Ph.D.   [facility] [address], [address] [place], [place] [address] Tel: [phone] Fax: [phone]  Verbal informed consent was obtained from the patient, patient was informed of potential risk of procedure, including bruising, bleeding, hematoma formation, infection, muscle weakness, muscle pain, numbness, among others.        MNC    Nerve / Sites Muscle Latency Ref. Amplitude Ref. Rel Amp Segments Distance Velocity  Ref. Area    ms ms mV mV %  cm m/s m/s mVms  R Median - APB     Wrist APB 4.3 <=4.4 6.1 >=4.0 100 Wrist - APB 7   27.6     Upper arm APB 8.3  6.2  101 Upper arm - Wrist 20 50 >=49 27.7  R Ulnar - ADM     Wrist ADM 3.3 <=3.3 8.2 >=6.0 100 Wrist - ADM 7   35.6     B.Elbow ADM 6.1  8.3  101 B.Elbow - Wrist 14 51 >=49 37.7     A.Elbow ADM 8.9  8.8  106 A.Elbow - B.Elbow 14 50 >=49 36.8  R Peroneal - EDB     Ankle EDB 5.6 <=6.5 1.3 >=2.0 100 Ankle - EDB 9   4.5     Fib head EDB 13.5  1.4  101 Fib head - Ankle 29 37 >=44 4.4     Pop fossa EDB 16.0  1.2  88.8 Pop fossa - Fib head 9 36 >=44 4.3         Pop fossa - Ankle      L Peroneal - EDB     Ankle EDB 5.7 <=6.5 2.9 >=2.0 100 Ankle - EDB 9   9.8     Fib head EDB 12.0  2.8  94 Fib head - Ankle 27 42 >=44 8.7     Pop fossa EDB 14.3  3.2  116 Pop fossa - Fib head 10 44 >=44 10.9         Pop fossa - Ankle      R Tibial - AH     Ankle AH 5.4 <=5.8 8.5 >=4.0 100 Ankle - AH 9   22.9  Pop fossa AH 18.9  4.7  55.7 Pop fossa - Ankle 44 33 >=41 18.2  L Tibial - AH     Ankle AH 6.4 <=5.8 6.4 >=4.0 100 Ankle - AH 9   19.8     Pop fossa AH 16.5  4.3  66.8 Pop fossa - Ankle 42 42 >=41 20.3                 SNC    Nerve / Sites Rec. Site Peak Lat Ref.  Amp Ref. Segments Distance    ms ms V V  cm  R Sural - Ankle (Calf)     Calf Ankle 4.9 <=4.4 7 >=6 Calf - Ankle 14  L Sural - Ankle (Calf)     Calf Ankle 4.1 <=4.4 7 >=6 Calf - Ankle 14  R Median - Orthodromic (Dig II, Mid palm)     Dig II Wrist 3.7 <=3.4 12 >=10 Dig II - Wrist 13  R Ulnar - Orthodromic, (Dig V, Mid palm)     Dig V Wrist 3.0 <=3.1 11 >=5 Dig V - Wrist 71                 F  Wave    Nerve F Lat Ref.   ms ms  R Ulnar - ADM 29.7 <=32.0  R Tibial - AH 25.5 <=56.0  L Tibial - AH 52.1 <=56.0           EMG Summary Table    Spontaneous MUAP Recruitment  Muscle IA Fib PSW Fasc Other Amp Dur. Poly Pattern  R. Tibialis anterior Normal None None None _______ Normal Normal Normal  Normal  R. Tibialis posterior Normal None None None _______ Normal Normal Normal Normal  R. Peroneus longus Normal None None None _______ Normal Normal Normal Normal  R. Gastrocnemius (Medial head) Normal None None None _______ Normal Normal Normal Normal  R. Vastus lateralis Normal None None None _______ Normal Normal Normal Normal  R. Iliopsoas Normal None None None _______ Normal Normal Normal Normal  R. Lumbar paraspinals (low) Normal None None None _______ Normal Normal Normal Normal  R. Lumbar paraspinals (mid) Normal None None None _______ Normal Normal Normal Normal  R. First dorsal interosseous Normal None None None _______ Normal Normal Normal Normal  R. Pronator teres Normal None None None _______ Normal Normal Normal Normal  R. Biceps brachii Normal None None None _______ Normal Normal Normal Normal  R. Deltoid Normal None None None _______ Normal Normal Normal Normal  R. Triceps brachii Normal None None None _______ Normal Normal Normal Normal  R. Cervical paraspinals Normal None None None _______ Normal Normal Normal Normal

## 2024-01-08 NOTE — Patient Instructions (Addendum)
    Meds ordered this encounter  Medications   traZODone (DESYREL) 50 MG tablet    Sig: Take 2 tablets (100 mg total) by mouth at bedtime.    Dispense:  60 tablet    Refill:  11   Tried trazodone 50 mg every night 30 minutes before bedtime, if 1 tablet is not adequate, may take 2 tablets every night  Move nortriptyline  to evening time  Keep current dose of gabapentin      Taper off oxcarbazepine , you are taking 150 mg twice a day now, may take 1 tablets every night for 1 week, then stop morning dose as well

## 2024-01-14 ENCOUNTER — Encounter: Payer: Self-pay | Admitting: Family Medicine
